# Patient Record
Sex: Female | Born: 1980 | Race: White | Hispanic: No | Marital: Married | Smoking: Never smoker
Health system: Southern US, Community
[De-identification: ages and names within clinical notes are randomized; demographics above are authoritative.]

## PROBLEM LIST (undated history)

## (undated) DIAGNOSIS — K259 Gastric ulcer, unspecified as acute or chronic, without hemorrhage or perforation: Secondary | ICD-10-CM

## (undated) DIAGNOSIS — D649 Anemia, unspecified: Secondary | ICD-10-CM

## (undated) DIAGNOSIS — F329 Major depressive disorder, single episode, unspecified: Secondary | ICD-10-CM

## (undated) DIAGNOSIS — IMO0002 Reserved for concepts with insufficient information to code with codable children: Secondary | ICD-10-CM

## (undated) DIAGNOSIS — J45909 Unspecified asthma, uncomplicated: Secondary | ICD-10-CM

## (undated) DIAGNOSIS — N390 Urinary tract infection, site not specified: Secondary | ICD-10-CM

## (undated) DIAGNOSIS — N946 Dysmenorrhea, unspecified: Secondary | ICD-10-CM

## (undated) DIAGNOSIS — F419 Anxiety disorder, unspecified: Secondary | ICD-10-CM

## (undated) DIAGNOSIS — N809 Endometriosis, unspecified: Secondary | ICD-10-CM

## (undated) DIAGNOSIS — F32A Depression, unspecified: Secondary | ICD-10-CM

## (undated) DIAGNOSIS — B9620 Unspecified Escherichia coli [E. coli] as the cause of diseases classified elsewhere: Secondary | ICD-10-CM

## (undated) DIAGNOSIS — R51 Headache: Secondary | ICD-10-CM

## (undated) HISTORY — DX: Major depressive disorder, single episode, unspecified: F32.9

## (undated) HISTORY — DX: Anemia, unspecified: D64.9

## (undated) HISTORY — DX: Depression, unspecified: F32.A

## (undated) HISTORY — PX: CHOLECYSTECTOMY: SHX55

## (undated) HISTORY — DX: Reserved for concepts with insufficient information to code with codable children: IMO0002

## (undated) HISTORY — DX: Dysmenorrhea, unspecified: N94.6

## (undated) HISTORY — DX: Anxiety disorder, unspecified: F41.9

## (undated) HISTORY — DX: Gastric ulcer, unspecified as acute or chronic, without hemorrhage or perforation: K25.9

## (undated) HISTORY — DX: Endometriosis, unspecified: N80.9

## (undated) HISTORY — PX: APPENDECTOMY: SHX54

---

## 2013-06-04 ENCOUNTER — Ambulatory Visit (INDEPENDENT_AMBULATORY_CARE_PROVIDER_SITE_OTHER): Payer: BC Managed Care – PPO | Admitting: Obstetrics and Gynecology

## 2013-06-04 ENCOUNTER — Encounter: Payer: Self-pay | Admitting: Obstetrics and Gynecology

## 2013-06-04 VITALS — BP 98/50 | HR 64 | Ht 59.0 in | Wt 100.0 lb

## 2013-06-04 DIAGNOSIS — Z01419 Encounter for gynecological examination (general) (routine) without abnormal findings: Secondary | ICD-10-CM

## 2013-06-04 DIAGNOSIS — N926 Irregular menstruation, unspecified: Secondary | ICD-10-CM

## 2013-06-04 DIAGNOSIS — N946 Dysmenorrhea, unspecified: Secondary | ICD-10-CM

## 2013-06-04 DIAGNOSIS — Z0000 Encounter for general adult medical examination without abnormal findings: Secondary | ICD-10-CM

## 2013-06-04 DIAGNOSIS — Z113 Encounter for screening for infections with a predominantly sexual mode of transmission: Secondary | ICD-10-CM

## 2013-06-04 LAB — STD PANEL

## 2013-06-04 LAB — POCT URINALYSIS DIPSTICK
Blood, UA: NEGATIVE
Nitrite, UA: NEGATIVE
Protein, UA: NEGATIVE
Urobilinogen, UA: NEGATIVE

## 2013-06-04 LAB — HEPATITIS C ANTIBODY: HCV Ab: NEGATIVE

## 2013-06-04 MED ORDER — IBUPROFEN 800 MG PO TABS: 800.0000 mg | ORAL_TABLET | Freq: Three times a day (TID) | ORAL | Status: DC | PRN

## 2013-06-04 MED ORDER — TRAMADOL HCL 50 MG PO TABS: 50.0000 mg | ORAL_TABLET | Freq: Four times a day (QID) | ORAL | Status: DC | PRN

## 2013-06-04 NOTE — Patient Instructions (Addendum)
EXERCISE AND DIET:  We recommended that you start or continue a regular exercise program for good health. Regular exercise means any activity that makes your heart beat faster and makes you sweat.  We recommend exercising at least 30 minutes per day at least 3 days a week, preferably 4 or 5.  We also recommend a diet low in fat and sugar.  Inactivity, poor dietary choices and obesity can cause diabetes, heart attack, stroke, and kidney damage, among others.    ALCOHOL AND SMOKING:  Women should limit their alcohol intake to no more than 7 drinks/beers/glasses of wine (combined, not each!) per week. Moderation of alcohol intake to this level decreases your risk of breast cancer and liver damage. And of course, no recreational drugs are part of a healthy lifestyle.  And absolutely no smoking or even second hand smoke. Most people know smoking can cause heart and lung diseases, but did you know it also contributes to weakening of your bones? Aging of your skin?  Yellowing of your teeth and nails?  CALCIUM AND VITAMIN D:  Adequate intake of calcium and Vitamin D are recommended.  The recommendations for exact amounts of these supplements seem to change often, but generally speaking 600 mg of calcium (either carbonate or citrate) and 800 units of Vitamin D per day seems prudent. Certain women may benefit from higher intake of Vitamin D.  If you are among these women, your doctor will have told you during your visit.    PAP SMEARS:  Pap smears, to check for cervical cancer or precancers,  have traditionally been done yearly, although recent scientific advances have shown that most women can have pap smears less often.  However, every woman still should have a physical exam from her gynecologist every year. It will include a breast check, inspection of the vulva and vagina to check for abnormal growths or skin changes, a visual exam of the cervix, and then an exam to evaluate the size and shape of the uterus and  ovaries.  And after 30 years of age, a rectal exam is indicated to check for rectal cancers. We will also provide age appropriate advice regarding health maintenance, like when you should have certain vaccines, screening for sexually transmitted diseases, bone density testing, colonoscopy, mammograms, etc.   MAMMOGRAMS:  All women over 40 years old should have a yearly mammogram. Many facilities now offer a "3D" mammogram, which may cost around $50 extra out of pocket. If possible,  we recommend you accept the option to have the 3D mammogram performed.  It both reduces the number of women who will be called back for extra views which then turn out to be normal, and it is better than the routine mammogram at detecting truly abnormal areas.    COLONOSCOPY:  Colonoscopy to screen for colon cancer is recommended for all women at age 50.  We know, you hate the idea of the prep.  We agree, BUT, having colon cancer and not knowing it is worse!!  Colon cancer so often starts as a polyp that can be seen and removed at colonscopy, which can quite literally save your life!  And if your first colonoscopy is normal and you have no family history of colon cancer, most women don't have to have it again for 10 years.  Once every ten years, you can do something that may end up saving your life, right?  We will be happy to help you get it scheduled when you are ready.    Be sure to check your insurance coverage so you understand how much it will cost.  It may be covered as a preventative service at no cost, but you should check your particular policy.    Levonorgestrel intrauterine device (IUD) What is this medicine? LEVONORGESTREL IUD (LEE voe nor jes trel) is a contraceptive (birth control) device. The device is placed inside the uterus by a healthcare professional. It is used to prevent pregnancy and can also be used to treat heavy bleeding that occurs during your period. Depending on the device, it can be used for 3 to 5  years. This medicine may be used for other purposes; ask your health care provider or pharmacist if you have questions. What should I tell my health care provider before I take this medicine? They need to know if you have any of these conditions: -abnormal Pap smear -cancer of the breast, uterus, or cervix -diabetes -endometritis -genital or pelvic infection now or in the past -have more than one sexual partner or your partner has more than one partner -heart disease -history of an ectopic or tubal pregnancy -immune system problems -IUD in place -liver disease or tumor -problems with blood clots or take blood-thinners -use intravenous drugs -uterus of unusual shape -vaginal bleeding that has not been explained -an unusual or allergic reaction to levonorgestrel, other hormones, silicone, or polyethylene, medicines, foods, dyes, or preservatives -pregnant or trying to get pregnant -breast-feeding How should I use this medicine? This device is placed inside the uterus by a health care professional. Talk to your pediatrician regarding the use of this medicine in children. Special care may be needed. Overdosage: If you think you have taken too much of this medicine contact a poison control center or emergency room at once. NOTE: This medicine is only for you. Do not share this medicine with others. What if I miss a dose? This does not apply. What may interact with this medicine? Do not take this medicine with any of the following medications: -amprenavir -bosentan -fosamprenavir This medicine may also interact with the following medications: -aprepitant -barbiturate medicines for inducing sleep or treating seizures -bexarotene -griseofulvin -medicines to treat seizures like carbamazepine, ethotoin, felbamate, oxcarbazepine, phenytoin, topiramate -modafinil -pioglitazone -rifabutin -rifampin -rifapentine -some medicines to treat HIV infection like atazanavir, indinavir,  lopinavir, nelfinavir, tipranavir, ritonavir -St. John's wort -warfarin This list may not describe all possible interactions. Give your health care provider a list of all the medicines, herbs, non-prescription drugs, or dietary supplements you use. Also tell them if you smoke, drink alcohol, or use illegal drugs. Some items may interact with your medicine. What should I watch for while using this medicine? Visit your doctor or health care professional for regular check ups. See your doctor if you or your partner has sexual contact with others, becomes HIV positive, or gets a sexual transmitted disease. This product does not protect you against HIV infection (AIDS) or other sexually transmitted diseases. You can check the placement of the IUD yourself by reaching up to the top of your vagina with clean fingers to feel the threads. Do not pull on the threads. It is a good habit to check placement after each menstrual period. Call your doctor right away if you feel more of the IUD than just the threads or if you cannot feel the threads at all. The IUD may come out by itself. You may become pregnant if the device comes out. If you notice that the IUD has come out use a backup birth  control method like condoms and call your health care provider. Using tampons will not change the position of the IUD and are okay to use during your period. What side effects may I notice from receiving this medicine? Side effects that you should report to your doctor or health care professional as soon as possible: -allergic reactions like skin rash, itching or hives, swelling of the face, lips, or tongue -fever, flu-like symptoms -genital sores -high blood pressure -no menstrual period for 6 weeks during use -pain, swelling, warmth in the leg -pelvic pain or tenderness -severe or sudden headache -signs of pregnancy -stomach cramping -sudden shortness of breath -trouble with balance, talking, or walking -unusual  vaginal bleeding, discharge -yellowing of the eyes or skin Side effects that usually do not require medical attention (report to your doctor or health care professional if they continue or are bothersome): -acne -breast pain -change in sex drive or performance -changes in weight -cramping, dizziness, or faintness while the device is being inserted -headache -irregular menstrual bleeding within first 3 to 6 months of use -nausea This list may not describe all possible side effects. Call your doctor for medical advice about side effects. You may report side effects to FDA at 1-800-FDA-1088. Where should I keep my medicine? This does not apply. NOTE: This sheet is a summary. It may not cover all possible information. If you have questions about this medicine, talk to your doctor, pharmacist, or health care provider.  2013, [employer]/Gold Standard. (12/20/2011 1:54:04 PM) Dysmenorrhea Menstrual pain is caused by the muscles of the uterus tightening (contracting) during a menstrual period. The muscles of the uterus contract due to the chemicals in the uterine lining. Primary dysmenorrhea is menstrual cramps that last a couple of days when you start having menstrual periods or soon after. This often begins after a teenager starts having her period. As a woman gets older or has a baby, the cramps will usually lesson or disappear. Secondary dysmenorrhea begins later in life, lasts longer, and the pain may be stronger than primary dysmenorrhea. The pain may start before the period and last a few days after the period. This type of dysmenorrhea is usually caused by an underlying problem such as:  The tissue lining the uterus grows outside of the uterus in other areas of the body (endometriosis).  The endometrial tissue, which normally lines the uterus, is found in or grows into the muscular walls of the uterus (adenomyosis).  The pelvic blood vessels are engorged with blood just before the menstrual  period (pelvic congestive syndrome).  Overgrowth of cells in the lining of the uterus or cervix (polyps of the uterus or cervix).  Falling down of the uterus (prolapse) because of loose or stretched ligaments.  Depression.  Bladder problems, infection, or inflammation.  Problems with the intestine, a tumor, or irritable bowel syndrome.  Cancer of the female organs or bladder.  A severely tipped uterus.  A very tight opening or closed cervix.  Noncancerous tumors of the uterus (fibroids).  Pelvic inflammatory disease (PID).  Pelvic scarring (adhesions) from a previous surgery.  Ovarian cyst.  An intrauterine device (IUD) used for birth control. CAUSES  The cause of menstrual pain is often unknown. SYMPTOMS   Cramping or throbbing pain in your lower abdomen.  Sometimes, a woman may also experience headaches.  Lower back pain.  Feeling sick to your stomach (nausea) or vomiting.  Diarrhea.  Sweating or dizziness. DIAGNOSIS  A diagnosis is based on your history, symptoms, physical examination,  diagnostic tests, or procedures. Diagnostic tests or procedures may include:  Blood tests.  An ultrasound.  An examination of the lining of the uterus (dilation and curettage, D&C).  An examination inside your abdomen or pelvis with a scope (laparoscopy).  X-rays.  CT Scan.  MRI.  An examination inside the bladder with a scope (cystoscopy).  An examination inside the intestine or stomach with a scope (colonoscopy, gastroscopy). TREATMENT  Treatment depends on the cause of the dysmenorrhea. Treatment may include:  Pain medicine prescribed by your caregiver.  Birth control pills.  Hormone replacement therapy.  Nonsteroidal anti-inflammatory drugs (NSAIDs). These may help stop the production of prostaglandins.  An IUD with progesterone hormone in it.  Acupuncture.  Surgery to remove adhesions, endometriosis, ovarian cyst, or fibroids.  Removal of the uterus  (hysterectomy).  Progesterone shots to stop the menstrual period.  Cutting the nerves on the sacrum that go to the female organs (presacral neurectomy).  Electric currant to the sacral nerves (sacral nerve stimulation).  Antidepressant medicine.  Psychiatric therapy, counseling, or group therapy.  Exercise and physical therapy.  Meditation and yoga therapy. HOME CARE INSTRUCTIONS   Only take over-the-counter or prescription medicines for pain, discomfort, or fever as directed by your caregiver.  Place a heating pad or hot water bottle on your lower back or abdomen. Do not sleep with the heating pad.  Use aerobic exercises, walking, swimming, biking, and other exercises to help lessen the cramping.  Massage to the lower back or abdomen may help.  Stop smoking.  Avoid alcohol and caffeine.  Yoga, meditation, or acupuncture may help. SEEK MEDICAL CARE IF:   The pain does not get better with medicine.  You have pain with sexual intercourse. SEEK IMMEDIATE MEDICAL CARE IF:   Your pain increases and is not controlled with medicines.  You have a fever.  You develop nausea or vomiting with your period not controlled with medicine.  You have abnormal vaginal bleeding with your period.  You pass out. MAKE SURE YOU:   Understand these instructions.  Will watch your condition.  Will get help right away if you are not doing well or get worse. Document Released: 11/19/2005 Document Revised: 02/11/2012 Document Reviewed: 03/07/2009 [facility] Patient Information 2014 [place], [place].

## 2013-06-04 NOTE — Progress Notes (Signed)
Patient ID: Sheri Cameron, female   DOB: March 05, 1981, 32 y.o.   MRN: [id number] 32 y.o.   Single    Caucasian   female   [id number]   here for annual exam.   Patient states having horrible period pain, bleeding between periods.  Two to three periods a month.  This begin the last three months.  Taking pills on time every day.  Began a generic pill. Takes Midol for period cramps which is not helping.  Occasional painful intercourse.    Considering a switch to go off the pill. Wants something more reliable and better pain control. Worried about weight gain.    Patient's last menstrual period was 05/25/2013.          Sexually active: yes  The current method of family planning is OCP (estrogen/progesterone).    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  Had Colposcopy and LEEP which revealed LSIL.  Believes her paps were normal after this.  Smoking:no Alcohol: no Last colonoscopy:2012 had EGD/Colonoscopy due to abd. Pain.  Found to have E.Coli and gastric ulcer.[facility], [place]) - patient had this infection after was in [place].   Last Bone Density:  never Last tetanus shot: unsure Last cholesterol check: unsure  UPT negative Hgb:  13.6              Urine: Neg   Family History  Problem Relation Age of Onset  . Cancer Father     cholangiocarcinoma  . Breast cancer Paternal Grandmother   . Hypertension Paternal Grandmother   . Diabetes Paternal Grandmother   . Cancer Paternal Grandfather     cholangiocarcinoma/liver ca  . Cancer Maternal Grandfather     pancreatic cancer  . Ovarian cancer Maternal Aunt   . Hypertension Maternal Grandmother   . Diabetes Maternal Grandmother     There are no active problems to display for this patient.   Past Medical History  Diagnosis Date  . Anxiety   . Anemia   . Depression   . Dyspareunia     Past Surgical History  Procedure Laterality Date  . Appendectomy    .  Cholecystectomy      Allergies: Augmentin and Erythromycin  Current Outpatient Prescriptions  Medication Sig Dispense Refill  . levonorgestrel-ethinyl estradiol (AVIANE,ALESSE,LESSINA) 0.1-20 MG-MCG tablet Take 1 tablet by mouth daily.      . sertraline (ZOLOFT) 25 MG tablet Take 25 mg by mouth daily. Take 2 tabs daily      . traZODone (DESYREL) 100 MG tablet Take 100 mg by mouth at bedtime. 1/2 to 2 tablets at bedtime       No current facility-administered medications for this visit.    ROS: Pertinent items are noted in HPI.  Social Hx:  Engaged.  Marrying next April.  Will consider childbearing in April 2016.    Exam:    BP 98/50  Pulse 64  Ht 4\' 11"  (1.499 m)  Wt 100 lb (45.36 kg)  BMI 20.19 kg/m2  LMP 05/25/2013   Wt Readings from Last 3 Encounters:  06/04/13 100 lb (45.36 kg)     Ht Readings from Last 3 Encounters:  06/04/13 4\' 11"  (1.499 m)    General appearance: alert, cooperative and appears stated age Head: Normocephalic, without obvious abnormality, atraumatic Neck: no adenopathy, supple, symmetrical, trachea midline and thyroid not enlarged, symmetric, no tenderness/mass/nodules Lungs: clear to auscultation bilaterally Breasts: Inspection negative, No nipple retraction  or dimpling, No nipple discharge or bleeding, No axillary or supraclavicular adenopathy, Normal to palpation without dominant masses Heart: regular rate and rhythm Abdomen: soft, non-tender; bowel sounds normal; no masses,  no organomegaly Extremities: extremities normal, atraumatic, no cyanosis or edema Skin: Skin color, texture, turgor normal. Pustular rash of chest skin. Lymph nodes: Cervical, supraclavicular, and axillary nodes normal. No abnormal inguinal nodes palpated Neurologic: Grossly normal   Pelvic: External genitalia:  no lesions              Urethra:  normal appearing urethra with no masses, tenderness or lesions              Bartholins and Skenes: normal                  Vagina: normal appearing vagina with normal color and discharge, no lesions              Cervix: normal appearance              Pap taken: yes        Bimanual Exam:  Uterus:  uterus is normal size, shape, consistency and nontender                                      Adnexa: normal adnexa in size, nontender and no masses                                        A: normal gynecologic exam History of LEEP Dysmenorrhea History of peptic ulcer disease.     P: Do self breast exam pap smear Tramadol for pain.  See Epic orders. Discussed OCPs, NuvaRing, Skyla IUD.  Wants to pursue IUD.  Risks and benefits discussed with patient.  return annually or prn     An After Visit Summary was printed and given to the patient.

## 2013-06-08 LAB — IPS PAP TEST WITH REFLEX TO HPV

## 2013-06-10 ENCOUNTER — Telehealth: Payer: Self-pay | Admitting: Obstetrics and Gynecology

## 2013-06-10 NOTE — Telephone Encounter (Signed)
LMTCB during first five days of cycle to schedule Skyla placement.

## 2013-06-12 ENCOUNTER — Other Ambulatory Visit: Payer: Self-pay | Admitting: Obstetrics and Gynecology

## 2013-06-12 DIAGNOSIS — Z309 Encounter for contraceptive management, unspecified: Secondary | ICD-10-CM

## 2013-06-15 ENCOUNTER — Telehealth: Payer: Self-pay | Admitting: Obstetrics and Gynecology

## 2013-06-15 ENCOUNTER — Ambulatory Visit (INDEPENDENT_AMBULATORY_CARE_PROVIDER_SITE_OTHER): Payer: BC Managed Care – PPO

## 2013-06-15 ENCOUNTER — Other Ambulatory Visit: Payer: Self-pay | Admitting: Obstetrics and Gynecology

## 2013-06-15 ENCOUNTER — Ambulatory Visit (INDEPENDENT_AMBULATORY_CARE_PROVIDER_SITE_OTHER): Payer: BC Managed Care – PPO | Admitting: Obstetrics and Gynecology

## 2013-06-15 ENCOUNTER — Encounter: Payer: Self-pay | Admitting: Obstetrics and Gynecology

## 2013-06-15 ENCOUNTER — Encounter: Payer: BC Managed Care – PPO | Admitting: Obstetrics and Gynecology

## 2013-06-15 VITALS — BP 100/60 | HR 82 | Ht 59.0 in

## 2013-06-15 DIAGNOSIS — R102 Pelvic and perineal pain: Secondary | ICD-10-CM

## 2013-06-15 DIAGNOSIS — Q519 Congenital malformation of uterus and cervix, unspecified: Secondary | ICD-10-CM

## 2013-06-15 DIAGNOSIS — N946 Dysmenorrhea, unspecified: Secondary | ICD-10-CM

## 2013-06-15 DIAGNOSIS — N926 Irregular menstruation, unspecified: Secondary | ICD-10-CM

## 2013-06-15 DIAGNOSIS — N949 Unspecified condition associated with female genital organs and menstrual cycle: Secondary | ICD-10-CM

## 2013-06-15 DIAGNOSIS — Q5128 Other doubling of uterus, other specified: Secondary | ICD-10-CM

## 2013-06-15 MED ORDER — ETONOGESTREL-ETHINYL ESTRADIOL 0.12-0.015 MG/24HR VA RING: VAGINAL_RING | VAGINAL | Status: DC

## 2013-06-15 NOTE — Patient Instructions (Addendum)

## 2013-06-15 NOTE — Telephone Encounter (Signed)
Please precert pelvic ultrasound for evaluation of irregular vaginal bleeding.  Will need this before she can have her IUD inserted.

## 2013-06-15 NOTE — Progress Notes (Signed)
   Sujbective  Here for pelvic ultrasound for irregular vaginal bleeding.  Also having dysmenorrhea. Patient was interested in [place] IUD. Switch to generic Alesse 4 months ago.   Tried Ortho Tricyclen and had break through bleeding. Depo Provera worked well but patient states she did not have a cycle and her prior provider thought it was best to discontinue this. Considering childbearing in 2015 or 2016.  Marrying next year.  Had one miscarriage at [redacted] weeks gestation.  Objective  See ultrasound below - Subseptate uterus with thin endometrium.  No polyp or fibroids noted.  Normal ovaries.      Assessment  Subseptate uterus - Mullerian anomaly. Irregular menses on OCPs. Dysmenorrhea. Desire for future fertility.  Plan  Switch to NuvaRing.  Use each ring for 3 weeks, and do this for 3 rings in a row.  Patient given one sample with Lot number [id number], Expiration 09/2015.  Patient instructed in use. Use condoms for the first month. Proceed with MRI of pelvis and kidneys. I discussed the subseptate uterus, affects on future fertility, and potential surgical repair with hysteroscopy and laparoscopy, which would also give an opportunity to evaluate and treat endometriosis if it is present.

## 2013-06-15 NOTE — Telephone Encounter (Signed)
Patient called to verify that she could still have her IUD placed since she is still only spotting. Today is day 4 of spotting. Canceled IUD insertion and scheduled PUS at 3:30pm per Dr. Edward Jolly.

## 2013-06-17 ENCOUNTER — Other Ambulatory Visit: Payer: Self-pay | Admitting: Obstetrics and Gynecology

## 2013-06-17 ENCOUNTER — Inpatient Hospital Stay (HOSPITAL_COMMUNITY)
Admission: AD | Admit: 2013-06-17 | Discharge: 2013-06-18 | Disposition: A | Discharge status: Home / Self Care | Payer: BC Managed Care – PPO | Source: Ambulatory Visit | Attending: Obstetrics & Gynecology | Admitting: Obstetrics & Gynecology

## 2013-06-17 DIAGNOSIS — R1013 Epigastric pain: Secondary | ICD-10-CM | POA: Insufficient documentation

## 2013-06-17 DIAGNOSIS — R112 Nausea with vomiting, unspecified: Secondary | ICD-10-CM | POA: Insufficient documentation

## 2013-06-17 DIAGNOSIS — N946 Dysmenorrhea, unspecified: Secondary | ICD-10-CM | POA: Insufficient documentation

## 2013-06-17 DIAGNOSIS — Q5128 Other doubling of uterus, other specified: Secondary | ICD-10-CM | POA: Insufficient documentation

## 2013-06-17 DIAGNOSIS — R102 Pelvic and perineal pain: Secondary | ICD-10-CM

## 2013-06-17 LAB — COMPREHENSIVE METABOLIC PANEL
ALT: 24 U/L (ref 0–35)
AST: 24 U/L (ref 0–37)
Albumin: 4 g/dL (ref 3.5–5.2)
Alkaline Phosphatase: 61 U/L (ref 39–117)
BUN: 7 mg/dL (ref 6–23)
Chloride: 96 mEq/L (ref 96–112)
Potassium: 3.7 mEq/L (ref 3.5–5.1)
Total Bilirubin: 0.6 mg/dL (ref 0.3–1.2)

## 2013-06-17 LAB — HCG, QUANTITATIVE, PREGNANCY: hCG, Beta Chain, Quant, S: 1 m[IU]/mL (ref <5)

## 2013-06-17 NOTE — MAU Note (Signed)
Pt states vaginal bleeding and abdominal pain began Monday night. States she has been feeling light headed and dizzy since this morning. States she is suppose to be scheduled for MRI for possible diagnosis of endometriosis. States she had a pelvic ultrasound on Monday.

## 2013-06-17 NOTE — Telephone Encounter (Signed)
Sheri Cameron,  Have the patient start the NuvaRing!  The sooner she starts, the faster we may be able to get her cramping with menses under control.  It is OK to place it if she is still bleeding.  [employer]

## 2013-06-17 NOTE — Telephone Encounter (Signed)
Patient notified on instructions from Dr. Edward Jolly to insert the Nuva Ring now to help cramping and bleeding. Fannie Knee will notify when MRI scheduled.

## 2013-06-17 NOTE — MAU Note (Signed)
Pt called into triage room. Pt not in lobby. Will recheck in 15 minutes.

## 2013-06-17 NOTE — Telephone Encounter (Signed)
Patient requests "something stronger needs called in for pain." Patient reports she was up all night with pain. [facility] in [place] on [address].

## 2013-06-17 NOTE — Telephone Encounter (Signed)
Patient called. States is having severe cramping in abdomen and is passing "blood clots". States has not slept all night. Currently taking one tramadol Q 6hours as needed for pain. Last OV 06/04/2013 in EPIC Procedure pelvic ultrasound 06/15/2013 with instructions of NuvaRing. Patient states she has not used NuvaRing yet due to severe cramping she is having and bleeding. Called [facility] to schedule MRI of abdomen. Was told order would need to be with and wothout contrast. Dr. Edward Jolly aware of this and instructions from [facility] of how to put in MRI of pelvis and abdomenal with and without contrast is ; MRI  (space) Pelvis   Enter and a list will come up.  Patient notified of medication Tramadol 50mg  may be increased to 2 tablets Q 6hours as needed for cramping, pain. Patient understands this.

## 2013-06-18 ENCOUNTER — Other Ambulatory Visit: Payer: Self-pay | Admitting: Obstetrics and Gynecology

## 2013-06-18 ENCOUNTER — Telehealth: Payer: Self-pay | Admitting: Orthopedic Surgery

## 2013-06-18 DIAGNOSIS — N946 Dysmenorrhea, unspecified: Secondary | ICD-10-CM

## 2013-06-18 DIAGNOSIS — N949 Unspecified condition associated with female genital organs and menstrual cycle: Secondary | ICD-10-CM

## 2013-06-18 LAB — URINALYSIS, ROUTINE W REFLEX MICROSCOPIC
Bilirubin Urine: NEGATIVE
Glucose, UA: NEGATIVE mg/dL
Ketones, ur: NEGATIVE mg/dL
Protein, ur: NEGATIVE mg/dL

## 2013-06-18 LAB — URINE MICROSCOPIC-ADD ON

## 2013-06-18 LAB — ABO/RH: ABO/RH(D): A POS

## 2013-06-18 MED ORDER — OXYCODONE-ACETAMINOPHEN 5-325 MG PO TABS: 1.0000 | ORAL_TABLET | ORAL | Status: DC | PRN

## 2013-06-18 MED ORDER — ONDANSETRON 8 MG PO TBDP
8.0000 mg | ORAL_TABLET | Freq: Once | ORAL | Status: AC
  Administered 2013-06-18: 8 mg via ORAL
  Filled 2013-06-18: qty 1

## 2013-06-18 MED ORDER — PROMETHAZINE HCL 25 MG PO TABS: 25.0000 mg | ORAL_TABLET | Freq: Four times a day (QID) | ORAL | Status: DC | PRN

## 2013-06-18 MED ORDER — PROMETHAZINE HCL 25 MG PO TABS
25.0000 mg | ORAL_TABLET | Freq: Once | ORAL | Status: AC
  Administered 2013-06-18: 25 mg via ORAL
  Filled 2013-06-18: qty 1

## 2013-06-18 MED ORDER — OXYCODONE-ACETAMINOPHEN 5-325 MG PO TABS
2.0000 | ORAL_TABLET | Freq: Once | ORAL | Status: AC
  Administered 2013-06-18: 2 via ORAL
  Filled 2013-06-18: qty 2

## 2013-06-18 MED ORDER — KETOROLAC TROMETHAMINE 60 MG/2ML IM SOLN
60.0000 mg | Freq: Once | INTRAMUSCULAR | Status: AC
  Administered 2013-06-18: 60 mg via INTRAMUSCULAR
  Filled 2013-06-18: qty 2

## 2013-06-18 MED ORDER — ONDANSETRON 8 MG PO TBDP: 8.0000 mg | ORAL_TABLET | Freq: Three times a day (TID) | ORAL | Status: DC | PRN

## 2013-06-18 MED ORDER — KETOROLAC TROMETHAMINE 10 MG PO TABS: 10.0000 mg | ORAL_TABLET | Freq: Four times a day (QID) | ORAL | Status: DC | PRN

## 2013-06-18 NOTE — MAU Provider Note (Signed)
Chief Complaint: Vaginal Bleeding and Abdominal Pain   First Provider Initiated Contact with Patient 06/18/13 0052     SUBJECTIVE HPI: Sheri Cameron is a 32 y.o. G17P0020 female who presents with worsening bilateral low abdominal and epigastric abdominal pain, heavy vaginal bleeding, nausea and vomiting. She has a history of irregular, frequent periods and dysmenorrhea since menarche, all of which has worsened over last 3-4 months. She describes the pain as constant, 10/10 on pain scale, causing her to vomit and shake (typical for her more painful periods). LMP started 06/12/2013 as spotting times several days and progressed to heavy bleeding over the past 2 days.  Has been seen by Dr. Edward Jolly several times in the past 2 weeks for these problems. The initial plan was to insert an IUD, but Ultrasound on 06/15/2013 showed septate uterus, thin endometrial stripe and normal ovaries. She was prescribed tramadol for pain but states it doesn't help. Increased 100 mg doses without relief of pain. Prescribed NuvaRing for Cytotec on cramping. Patient started 06/17/2013. GC Chlamydia negative 06/15/2013. MRI ordered. Plan surgery for removal of septum and evaluation/treatment of possible endometriosis.  Past Medical History  Diagnosis Date  . Anxiety   . Anemia   . Depression   . Dyspareunia    OB History   Grav Para Term Preterm Abortions TAB SAB Ect Mult Living   2    2  2         # Outc Date GA Lbr Len/2nd Wgt Sex Del Anes PTL Lv   1 SAB            2 SAB              Past Surgical History  Procedure Laterality Date  . Appendectomy    . Cholecystectomy     History   Social History  . Marital Status: Single    Spouse Name: N/A    Number of Children: N/A  . Years of Education: N/A   Occupational History  . Not on file.   Social History Main Topics  . Smoking status: Never Smoker   . Smokeless tobacco: Not on file  . Alcohol Use: No  . Drug Use: No  . Sexually Active: Yes    Birth  Control/ Protection: IUD     Comment: Aviane   Other Topics Concern  . Not on file   Social History Narrative  . No narrative on file   No current facility-administered medications on file prior to encounter.   Current Outpatient Prescriptions on File Prior to Encounter  Medication Sig Dispense Refill  . etonogestrel-ethinyl estradiol (NUVARING) 0.12-0.015 MG/24HR vaginal ring Insert vaginally and leave in place for 3 consecutive weeks.  Then insert new ring for three weeks. Medically necessary for dysmenorrhea.  3 each  3  . sertraline (ZOLOFT) 25 MG tablet Take 50 mg by mouth at bedtime. Take 2 tabs daily      . traZODone (DESYREL) 100 MG tablet Take 50 mg by mouth at bedtime. 1/2 to 2 tablets at bedtime       Allergies  Allergen Reactions  . Augmentin (Amoxicillin-Pot Clavulanate) Hives  . Erythromycin Hives    ROS: Positive for abdominal pain, epigastric pain, nausea, vomiting, chronic dyspareunia. Negative for fever, chills, constipation, diarrhea, vaginal discharge  OBJECTIVE Blood pressure 112/63, pulse 76, temperature 97.5 F (36.4 C), temperature source Oral, resp. rate 18, last menstrual period 06/12/2013, SpO2 100.00%. GENERAL: Well-developed, slender female in severe distress, shaking, knees pulled into chest.  HEENT:  Normocephalic HEART: normal rate RESP: normal effort ABDOMEN: Soft, moderate generalized low abdominal and epigastric tenderness. Normal bowel sounds x4. Voluntary guarding. No mass or rebound tenderness. BACK: No CVAT EXTREMITIES: Nontender, no edema NEURO: Alert and oriented SPECULUM EXAM: NEFG, small amount of bright red blood noted, cervix non--friable BIMANUAL: cervix closed; uterus normal size, bilateral adnexal tenderness and cervical motion tenderness (consistent with prior exams per patient). No masses.  LAB RESULTS Results for orders placed during the hospital encounter of 06/17/13 (from the past 24 hour(s))  HCG, QUANTITATIVE, PREGNANCY      Status: None   Collection Time    06/17/13  9:50 PM      Result Value Range   hCG, Beta Chain, Quant, S 1  <5 mIU/mL  ABO/RH     Status: None   Collection Time    06/17/13  9:50 PM      Result Value Range   ABO/RH(D) A POS    COMPREHENSIVE METABOLIC PANEL     Status: Abnormal   Collection Time    06/17/13  9:50 PM      Result Value Range   Sodium 134 (*) 135 - 145 mEq/L   Potassium 3.7  3.5 - 5.1 mEq/L   Chloride 96  96 - 112 mEq/L   CO2 28  19 - 32 mEq/L   Glucose, Bld 100 (*) 70 - 99 mg/dL   BUN 7  6 - 23 mg/dL   Creatinine, Ser 7.82  0.50 - 1.10 mg/dL   Calcium 9.8  8.4 - 95.6 mg/dL   Total Protein 7.2  6.0 - 8.3 g/dL   Albumin 4.0  3.5 - 5.2 g/dL   AST 24  0 - 37 U/L   ALT 24  0 - 35 U/L   Alkaline Phosphatase 61  39 - 117 U/L   Total Bilirubin 0.6  0.3 - 1.2 mg/dL   GFR calc non Af Amer >90  >90 mL/min   GFR calc Af Amer >90  >90 mL/min  URINALYSIS, ROUTINE W REFLEX MICROSCOPIC     Status: Abnormal   Collection Time    06/17/13 10:45 PM      Result Value Range   Color, Urine YELLOW  YELLOW   APPearance CLEAR  CLEAR   Specific Gravity, Urine >1.030 (*) 1.005 - 1.030   pH 6.0  5.0 - 8.0   Glucose, UA NEGATIVE  NEGATIVE mg/dL   Hgb urine dipstick LARGE (*) NEGATIVE   Bilirubin Urine NEGATIVE  NEGATIVE   Ketones, ur NEGATIVE  NEGATIVE mg/dL   Protein, ur NEGATIVE  NEGATIVE mg/dL   Urobilinogen, UA 1.0  0.0 - 1.0 mg/dL   Nitrite NEGATIVE  NEGATIVE   Leukocytes, UA NEGATIVE  NEGATIVE  URINE MICROSCOPIC-ADD ON     Status: None   Collection Time    06/17/13 10:45 PM      Result Value Range   Squamous Epithelial / LPF RARE  RARE   WBC, UA 0-2  <3 WBC/hpf   RBC / HPF 3-6  <3 RBC/hpf   Urine-Other CA OXALATE CRYSTALS      IMAGING N/A  MAU COURSE Pain decreased to 7/10 after Percocet. Patient in no apparent distress. Requesting medication for nausea. Phenergan ordered. Nausea and vomiting improving.  Discussed patient's symptoms, history, ultrasound results,  response to Percocet, plan of care per Dr. Rica Records notes with Dr. Audie Box. No surgical or life-threatening condition evident. Add Toradol for better pain control and followup with Dr. Edward Jolly in the office  today. May prescribe Percocet for pain.  Patient vomited once more before discharge but stated she was feeling much better. Zofran ODT given. Patient relaxed and smiling.  ASSESSMENT 1. Pelvic pain in female   2. Nausea & vomiting     PLAN Discharge home in stable condition. Did not overlap Percocet with tramadol. Use caution with taking multiple sedating medications. Urine culture pending. Follow-up Information   Call Conley Simmonds, MD. (today)    Contact information:   [address] [place] [address] [phone]       Follow up with [facility]. (As needed in emergencies)    Contact information:   [address] [id number] [place] [address] [phone]        Medication List         etonogestrel-ethinyl estradiol 0.12-0.015 MG/24HR vaginal ring  Commonly known as:  NUVARING  Insert vaginally and leave in place for 3 consecutive weeks.  Then insert new ring for three weeks. Medically necessary for dysmenorrhea.     ketorolac 10 MG tablet  Commonly known as:  TORADOL  Take 1 tablet (10 mg total) by mouth every 6 (six) hours as needed for pain.     ondansetron 8 MG disintegrating tablet  Commonly known as:  ZOFRAN ODT  Take 1 tablet (8 mg total) by mouth every 8 (eight) hours as needed for nausea.     oxyCODONE-acetaminophen 5-325 MG per tablet  Commonly known as:  PERCOCET/ROXICET  Take 1 tablet by mouth every 4 (four) hours as needed for pain. For breakthrough pain.     promethazine 25 MG tablet  Commonly known as:  PHENERGAN  Take 1 tablet (25 mg total) by mouth every 6 (six) hours as needed for nausea.     sertraline 25 MG tablet  Commonly known as:  ZOLOFT  Take 50 mg by mouth  at bedtime. Take 2 tabs daily     traMADol 50 MG tablet  Commonly known as:  ULTRAM  Take 100 mg by mouth every 6 (six) hours as needed for pain.     traZODone 100 MG tablet  Commonly known as:  DESYREL  Take 50 mg by mouth at bedtime. 1/2 to 2 tablets at bedtime       Dorathy Kinsman, [place] 06/18/2013  3:42 AM

## 2013-06-18 NOTE — Telephone Encounter (Signed)
See phone note from 06-18-13.

## 2013-06-18 NOTE — Telephone Encounter (Signed)
Pt states she went to ER last night for severe cramping and pain associated with her cycle. She was given oxycodone, phenergan, and zofran. Pt states she is doing much better now, "as long as I take my pain meds." Pt was told to follow up with Dr. Edward Jolly. Pt supposed to have an MRI done per BS from last visit. Pt would like to have MRI done, then come in for a visit. Sched OV for next week 06-25-13 at 1:15 with BS. Will call pt back about MRI scheduling.

## 2013-06-18 NOTE — Telephone Encounter (Signed)
Spoke with pt about appt at [facility] for MRI of pelvis. Appt 06-21-13 at 2 pm. [address] location. Pt agreeable.

## 2013-06-21 ENCOUNTER — Ambulatory Visit
Admission: RE | Admit: 2013-06-21 | Discharge: 2013-06-21 | Disposition: A | Discharge status: Home / Self Care | Payer: BC Managed Care – PPO | Source: Ambulatory Visit | Attending: Obstetrics and Gynecology | Admitting: Obstetrics and Gynecology

## 2013-06-21 MED ORDER — GADOBENATE DIMEGLUMINE 529 MG/ML IV SOLN
9.0000 mL | Freq: Once | INTRAVENOUS | Status: AC | PRN
  Administered 2013-06-21: 9 mL via INTRAVENOUS

## 2013-06-23 ENCOUNTER — Telehealth: Payer: Self-pay | Admitting: *Deleted

## 2013-06-23 NOTE — Telephone Encounter (Signed)
Patient calling of concern of severe cramping and backache even though she is not on her cycle. Stated she did have her MRI of Pelvis done on Sunday 06/21/2013 . States she was not sure to take pain medication of tramadol that she was given in the E.R. . Explained to patient to take her pain medication Tramadol as needed q 6 hours. Has appt. To follow up with Dr. Edward Jolly on Thursday. Patient was calling also to let Dr. Edward Jolly know of her family history. Was told her mom has never had any history of endometriosis but that her moms sisters have . One sister had to have complete hysterectomy due to endometriosis Called  Imaging to check on report of MRI Abdomen. Was told that Dr. Grayling Congress stated that MRI of Abdomen was not necessary because the kidneys would show on the pelvis MRI and there was no need to do MRI Abdomen and charge patient when the same thing would be seen on the MRI of pelvis./kidneys. Last OV 06/15/2013

## 2013-06-23 NOTE — Telephone Encounter (Signed)
Patient calling with questions for nurse about menses and has found out she has family history of endometriosis.

## 2013-06-24 ENCOUNTER — Telehealth: Payer: Self-pay | Admitting: Obstetrics and Gynecology

## 2013-06-24 NOTE — Telephone Encounter (Signed)
Fannie Knee,  Thank you for the update.  Please call [facility] back and have them add an addendum to the MRI regarding her kidneys and ureters.  There is no documentation on the pelvic MRI about this.    [employer]

## 2013-06-24 NOTE — Telephone Encounter (Signed)
[facility] as requested per Dr. Edward Jolly. Spoke with receptionist , explained what was needed for Dr. Edward Jolly for the report of MRI of pelvis done 06/21/2013/. Dr.Silva request addendum of MRI of kidneys and ureters. Was put on hold. Was told to call medical records at the [address] location and leave message of this. Was transferred to [address] medical records dept. And left message regarding patient MRI and addendum that was needed.

## 2013-06-24 NOTE — Telephone Encounter (Signed)
Victorino Dike from [facility] call and let message that she has a cal into [facility] to do and addendum .   Victorino Dike #  [phone]

## 2013-06-25 ENCOUNTER — Encounter: Payer: Self-pay | Admitting: Obstetrics and Gynecology

## 2013-06-25 ENCOUNTER — Telehealth: Payer: Self-pay | Admitting: Obstetrics and Gynecology

## 2013-06-25 ENCOUNTER — Ambulatory Visit (INDEPENDENT_AMBULATORY_CARE_PROVIDER_SITE_OTHER): Payer: BC Managed Care – PPO | Admitting: Obstetrics and Gynecology

## 2013-06-25 VITALS — BP 100/62 | HR 60 | Ht 59.0 in | Wt 99.5 lb

## 2013-06-25 DIAGNOSIS — Q51818 Other congenital malformations of uterus: Secondary | ICD-10-CM

## 2013-06-25 DIAGNOSIS — Q519 Congenital malformation of uterus and cervix, unspecified: Secondary | ICD-10-CM

## 2013-06-25 DIAGNOSIS — N946 Dysmenorrhea, unspecified: Secondary | ICD-10-CM | POA: Insufficient documentation

## 2013-06-25 NOTE — Telephone Encounter (Signed)
Phone call to patient to clarify her diagnosis is bicornuate uterus.    No answer.  Left message for her to call back.

## 2013-06-25 NOTE — Patient Instructions (Signed)
Please call if your pain continues.

## 2013-06-25 NOTE — Progress Notes (Signed)
Patient ID: Sheri Cameron, female   DOB: 1981/09/29, 32 y.o.   MRN: [id number]  Subjective  Here for follow up of MRI and follow up of emergency department visit.   Patient's mother is present for the discussion today.  Went to ER at [facility] and was treated for pain 06/17/13.   Pain began with menses.  Doubled over with pain, nausea, vomiting.   Has a history of painful menstruation and missing work. Patient received Rx for Zofran, phenergan, and Percocet. Placed Nuva Ring about 10 days ago.    Has had negative GC/CT.  Still having some pain.  Wants surgery for uterine septum removal and laparoscopy for pain. Pain will need to wait until after November 2nd due to new employment.   Heide Spark is on April 25th.    History of laparoscopic cholecystectomy and appendectomy.  History of SAB at [redacted] weeks gestation.   Objective  General - smiling and talkative.  No pelvic exam today.  Addendum    Danae Orleans, MD   Wed Jun 24, 2013  3:03:11 PM EDT       **ADDENDUM** CREATED: 06/24/2013 15:00:27   These findings were discussed with Dr. Edward Jolly by telephone on 06/24/2013.  The scout images provide limited visualization of the kidneys, however both kidneys are normally positioned in the abdomen and no hydronephrosis seen.   **END ADDENDUM** SIGNED BY: John A. Eppie Gibson, M.D.     Study Result    *RADIOLOGY REPORT*   Clinical Data: Mullerian duct anomaly.   MRI PELVIS WITHOUT AND WITH CONTRAST   Technique:  Multiplanar multisequence MR imaging of the pelvis was performed both before and after administration of intravenous contrast.   Contrast: 9mL MULTIHANCE GADOBENATE DIMEGLUMINE 529 MG/ML IV SOLN   Comparison: Ultrasound from [facility] on 06/15/2013   Findings: The uterus is anteverted, and measures 5.4 x 2.6 x 4.7 cm. A septum extends from the uterine fundus into the upper portion of the endocervical canal. The uterine fundal contour shows a  mild cleft, and the apex of the fundal contour is less than 5 mm above the inter ostial line, which is consistent with a bicornuate uterus.  A single cervix is seen, which is normal in appearance.   No evidence of uterine fibroids.  No evidence of abnormal endometrial thickening.  Vagina is normal in appearance.  Nuvaring noted within the vagina.   Both ovaries are normal in appearance.  No evidence of adnexal mass or free fluid.  No other pelvic masses or lymphadenopathy identified.  No evidence of inflammatory process or abnormal fluid collections.   IMPRESSION:   1.  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 2.  No evidence of fibroids. 3.  Normal ovaries.  No adnexal mass identified.     Original Report Authenticated By: Myles Rosenthal, M.D.          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 On MRI review with Dr. Eppie Gibson, the patient has a thin septum separating the two side of the uterine cavity and that her diagnosis is  A bicornuate uterus due to the small 5 mm cleft from the interostial line.  She does not a thick wall separating the two sides.  Separating the two cavities.    Dysmenorrhea and pelvic pain suspicious for endometriosis.   Plan  Patient will do continuous Nuva Ring, changing every three weeks.  We discussed surgical repair with a hysteroscopic resection of the septal wall with laparoscopic guidance.  This will also allow an opportunity for Diagnosis and treatment of endometriosis or pelvic adhesions.  We discussed that the laparoscopy may not reveal the source of her pain and that  The septoplasty would not resolve her pain but would simply unify the cavity and reduce risk of future miscarriage and preterm labor.   Other discussed risks of the procedure include but are not limited to bleeding, infection, trauma to  surrounding organs, DVT, PE, reaction to  Anesthesia, death, hyponatremia, pulmonary edema, uterine perforation, heavy uterine bleeding requiring Foley balloon treatment and hospital observation, Development of adhesive disease intraperitoneally or in the uterine cavity, and incompleteness of the procedure.    We discussed seeking a second opinion or having a reproductive endocrinologist proceed with consultation or surgical care.  Patient declines this At this time.    Questions were invited and answered.  She would like to proceed forward toward surgical planning for November 2014.  I will review the MRI films further prior to proceeding further.  She will call if she develops recurrent significant pelvic pain.

## 2013-07-01 NOTE — Progress Notes (Signed)
This encounter was created in error - please disregard.

## 2013-07-01 NOTE — Telephone Encounter (Signed)
Patient wanting to check the date of her surrgery.

## 2013-07-01 NOTE — Telephone Encounter (Signed)
Patient states her employer needs to be able to make plans for her when she will be out for surgery.  Patient requests first available date in November since she has a lot of pain.  Offered to schedule sooner if she desires but she states she has to wait till November due to work.  Will aim for 10-06-13 and try to schedule so date will be available.    How should I post this case and will you need an assistant? Do you want me to wait until you have seen her MRI before posting?

## 2013-07-05 NOTE — Telephone Encounter (Signed)
I have reviewed the MRI personally with radiologist, Dr. Kyung Rudd at the [facility].  The septum of the uterus continues down into the cervix.  It is all very thin.  I will proceed with a hysteroscopic resection of the septum and laparoscopy for dysmenorrhea with possible treatment of endometriosis.  No robot.  Diagnoses - uterine septum, dysmenorrhea.  This will need to be schedule in the early follicular phase, just after menses ends so that the endometrium will be thin.  One way to control this and still have the date that the patient chooses for the surgery is to have her continuously use her NuvaRing.  Surgical time will be 2 1/2 hours.  I will need a physician to assist in the entire procedure, as that person will need to control the laparoscopic camera while I am working vaginally.    Please proceed with precert.

## 2013-07-06 ENCOUNTER — Ambulatory Visit: Payer: BC Managed Care – PPO | Admitting: Obstetrics and Gynecology

## 2013-07-08 ENCOUNTER — Telehealth: Payer: Self-pay | Admitting: Obstetrics and Gynecology

## 2013-07-08 NOTE — Telephone Encounter (Signed)
Patient notified of Dr Rica Records instructions.  She is anxious to proceed with surgery on first available date in November (has to wait until Nov for employer issues).  Discussed continuous Nuvaring and Dr Edward Jolly will advise her at preop appointment if/when to remove Nuvaring.  Patient denies questions and states Dr Edward Jolly has already instructed her in this.  Will schedule surgery and call her back with date and instructions.

## 2013-07-24 NOTE — Telephone Encounter (Signed)
Patient is notified and aware that Dr. Edward Jolly would like to see her before she can give her any more refills scheduled patient for OV 07/27/13 @ 12:45

## 2013-07-24 NOTE — Telephone Encounter (Signed)
Please have patient come to the office for an office visit.   I generally do not give narcotic prescription refills without being seen personally.

## 2013-07-24 NOTE — Telephone Encounter (Signed)
Patient request refills of OXYCODONE, KETOROLAC  [facility] [phone]

## 2013-07-24 NOTE — Telephone Encounter (Signed)
Please advise AEX 06/18/13, Oxycodone last given #30/0rf's; Ketorolac #20/0rf's 06/18/13.

## 2013-07-27 ENCOUNTER — Encounter: Payer: Self-pay | Admitting: Obstetrics and Gynecology

## 2013-07-27 ENCOUNTER — Ambulatory Visit (INDEPENDENT_AMBULATORY_CARE_PROVIDER_SITE_OTHER): Payer: BC Managed Care – PPO | Admitting: Obstetrics and Gynecology

## 2013-07-27 VITALS — BP 104/62 | HR 60 | Resp 16 | Ht 59.0 in | Wt 99.0 lb

## 2013-07-27 DIAGNOSIS — M25559 Pain in unspecified hip: Secondary | ICD-10-CM

## 2013-07-27 MED ORDER — TRAMADOL HCL 50 MG PO TABS: 50.0000 mg | ORAL_TABLET | Freq: Four times a day (QID) | ORAL | Status: DC | PRN

## 2013-07-27 MED ORDER — NORELGESTROMIN-ETH ESTRADIOL 150-35 MCG/24HR TD PTWK: 1.0000 | MEDICATED_PATCH | TRANSDERMAL | Status: DC

## 2013-07-27 NOTE — Progress Notes (Signed)
Patient ID: Sheri Cameron, female   DOB: 12-Mar-1981, 32 y.o.   MRN: [id number]  Subjective  LMP -  June, using NuvaRing continuously for 6 weeks.  Having cramping, pain, nausea.  Pain is not daily.  Pain usually comes usually when she would expect her menses, which patient reports is twice per month.  Pain is always lower abdominal bilateral.    OCPs and Depo Provera have not regulated patient's menses in the past.    Has had NuvaRing in since June 2014.  Uncertain if she is having benefit of the Ring.  This is the second week of the second ring.   Occasionally has spotting.    No fevers.  No dysuria.  No painful bowel movements.   Cannot have laparoscopy and excision of uterine septum until November 2014.   Has a history of peptic ulcer.  Ran out of oxycodone.  Objective  General - smiling.  Abdominal exam   Soft upper abdomen.  No hepatosplenomegaly or organomegaly. Lower abdomen - patient is doing guarding.  No rebound.   Pelvic exam   Ring is protruding through the hymen and it does not push back well into the vagina - seems to big for the patient.  Normal external genitalia and urethra.   Cervix and vagina no lesions. Uterus small and mildly tender. No adnexal masses or tenderness.  Assessment  Pelvic pain and dysmenorrhea. NuvaRing not successful and seems too large for the patient.  Known uterine septum. History of gastic ulcer.   Plan  Stop NuvaRing. Start [place] continuously.  Discussed risks of thromboembolic events.  See Epic orders.  Rx Ultram.   See Epic orders.  Patient will return for a preop visit and prn.

## 2013-07-27 NOTE — Patient Instructions (Signed)
Tramadol tablets What is this medicine? TRAMADOL (TRA ma dole) is a pain reliever. It is used to treat moderate to severe pain in adults. This medicine may be used for other purposes; ask your health care provider or pharmacist if you have questions. What should I tell my health care provider before I take this medicine? They need to know if you have any of these conditions: -brain tumor -depression -drug abuse or addiction -head injury -if you frequently drink alcohol containing drinks -kidney disease or trouble passing urine -liver disease -lung disease, asthma, or breathing problems -seizures or epilepsy -suicidal thoughts, plans, or attempt; a previous suicide attempt by you or a family member -an unusual or allergic reaction to tramadol, codeine, other medicines, foods, dyes, or preservatives -pregnant or trying to get pregnant -breast-feeding How should I use this medicine? Take this medicine by mouth with a full glass of water. Follow the directions on the prescription label. If the medicine upsets your stomach, take it with food or milk. Do not take more medicine than you are told to take. Talk to your pediatrician regarding the use of this medicine in children. Special care may be needed. Overdosage: If you think you have taken too much of this medicine contact a poison control center or emergency room at once. NOTE: This medicine is only for you. Do not share this medicine with others. What if I miss a dose? If you miss a dose, take it as soon as you can. If it is almost time for your next dose, take only that dose. Do not take double or extra doses. What may interact with this medicine? Do not take this medicine with any of the following medications: -MAOIs like Carbex, Eldepryl, Marplan, Nardil, and Parnate This medicine may also interact with the following medications: -alcohol or medicines that contain alcohol -antihistamines -benzodiazepines -bupropion -carbamazepine  or oxcarbazepine -clozapine -cyclobenzaprine -digoxin -furazolidone -linezolid -medicines for depression, anxiety, or psychotic disturbances -medicines for migraine headache like almotriptan, eletriptan, frovatriptan, naratriptan, rizatriptan, sumatriptan, zolmitriptan -medicines for pain like pentazocine, buprenorphine, butorphanol, meperidine, nalbuphine, and propoxyphene -medicines for sleep -muscle relaxants -naltrexone -phenobarbital -phenothiazines like perphenazine, thioridazine, chlorpromazine, mesoridazine, fluphenazine, prochlorperazine, promazine, and trifluoperazine -procarbazine -warfarin This list may not describe all possible interactions. Give your health care provider a list of all the medicines, herbs, non-prescription drugs, or dietary supplements you use. Also tell them if you smoke, drink alcohol, or use illegal drugs. Some items may interact with your medicine. What should I watch for while using this medicine? Tell your doctor or health care professional if your pain does not go away, if it gets worse, or if you have new or a different type of pain. You may develop tolerance to the medicine. Tolerance means that you will need a higher dose of the medicine for pain relief. Tolerance is normal and is expected if you take this medicine for a long time. Do not suddenly stop taking your medicine because you may develop a severe reaction. Your body becomes used to the medicine. This does NOT mean you are addicted. Addiction is a behavior related to getting and using a drug for a non-medical reason. If you have pain, you have a medical reason to take pain medicine. Your doctor will tell you how much medicine to take. If your doctor wants you to stop the medicine, the dose will be slowly lowered over time to avoid any side effects. You may get drowsy or dizzy. Do not drive, use machinery, or do anything  that needs mental alertness until you know how this medicine affects you. Do  not stand or sit up quickly, especially if you are an older patient. This reduces the risk of dizzy or fainting spells. Alcohol can increase or decrease the effects of this medicine. Avoid alcoholic drinks. You may have constipation. Try to have a bowel movement at least every 2 to 3 days. If you do not have a bowel movement for 3 days, call your doctor or health care professional. Your mouth may get dry. Chewing sugarless gum or sucking hard candy, and drinking plenty of water may help. Contact your doctor if the problem does not go away or is severe. What side effects may I notice from receiving this medicine? Side effects that you should report to your doctor or health care professional as soon as possible: -allergic reactions like skin rash, itching or hives, swelling of the face, lips, or tongue -breathing difficulties, wheezing -confusion -itching -light headedness or fainting spells -redness, blistering, peeling or loosening of the skin, including inside the mouth -seizures Side effects that usually do not require medical attention (report to your doctor or health care professional if they continue or are bothersome): -constipation -dizziness -drowsiness -headache -nausea, vomiting This list may not describe all possible side effects. Call your doctor for medical advice about side effects. You may report side effects to FDA at 1-800-FDA-1088. Where should I keep my medicine? Keep out of the reach of children. Store at room temperature between 15 and 30 degrees C (59 and 86 degrees F). Keep container tightly closed. Throw away any unused medicine after the expiration date. NOTE: This sheet is a summary. It may not cover all possible information. If you have questions about this medicine, talk to your doctor, pharmacist, or health care provider.  2012, [employer] Standard. (08/02/2010 11:55:44 AM)

## 2013-08-21 ENCOUNTER — Telehealth: Payer: Self-pay | Admitting: *Deleted

## 2013-08-21 NOTE — Telephone Encounter (Signed)
Spoke with patient about her upcoming surgery, went over surgery information form. Pre op appt scheduled, Post op Appointments Scheduled . A copy of surgery info form was mailed to pt.

## 2013-08-26 ENCOUNTER — Encounter: Payer: Self-pay | Admitting: Obstetrics and Gynecology

## 2013-08-26 ENCOUNTER — Ambulatory Visit (INDEPENDENT_AMBULATORY_CARE_PROVIDER_SITE_OTHER): Payer: BC Managed Care – PPO | Admitting: Obstetrics and Gynecology

## 2013-08-26 VITALS — BP 100/56 | HR 80 | Ht 59.0 in | Wt 104.0 lb

## 2013-08-26 DIAGNOSIS — B373 Candidiasis of vulva and vagina: Secondary | ICD-10-CM

## 2013-08-26 MED ORDER — FLUCONAZOLE 150 MG PO TABS: 150.0000 mg | ORAL_TABLET | Freq: Once | ORAL | Status: DC

## 2013-08-26 MED ORDER — NYSTATIN-TRIAMCINOLONE 100000-0.1 UNIT/GM-% EX CREA: TOPICAL_CREAM | Freq: Two times a day (BID) | CUTANEOUS | Status: DC

## 2013-08-26 NOTE — Patient Instructions (Signed)
Monilial Vaginitis  Vaginitis in a soreness, swelling and redness (inflammation) of the vagina and vulva. Monilial vaginitis is not a sexually transmitted infection.  CAUSES   Yeast vaginitis is caused by yeast (candida) that is normally found in your vagina. With a yeast infection, the candida has overgrown in number to a point that upsets the chemical balance.  SYMPTOMS   · White, thick vaginal discharge.  · Swelling, itching, redness and irritation of the vagina and possibly the lips of the vagina (vulva).  · Burning or painful urination.  · Painful intercourse.  DIAGNOSIS   Things that may contribute to monilial vaginitis are:  · Postmenopausal and virginal states.  · Pregnancy.  · Infections.  · Being tired, sick or stressed, especially if you had monilial vaginitis in the past.  · Diabetes. Good control will help lower the chance.  · Birth control pills.  · Tight fitting garments.  · Using bubble bath, feminine sprays, douches or deodorant tampons.  · Taking certain medications that kill germs (antibiotics).  · Sporadic recurrence can occur if you become ill.  TREATMENT   Your caregiver will give you medication.  · There are several kinds of anti monilial vaginal creams and suppositories specific for monilial vaginitis. For recurrent yeast infections, use a suppository or cream in the vagina 2 times a week, or as directed.  · Anti-monilial or steroid cream for the itching or irritation of the vulva may also be used. Get your caregiver's permission.  · Painting the vagina with methylene blue solution may help if the monilial cream does not work.  · Eating yogurt may help prevent monilial vaginitis.  HOME CARE INSTRUCTIONS   · Finish all medication as prescribed.  · Do not have sex until treatment is completed or after your caregiver tells you it is okay.  · Take warm sitz baths.  · Do not douche.  · Do not use tampons, especially scented ones.  · Wear cotton underwear.  · Avoid tight pants and panty  hose.  · Tell your sexual partner that you have a yeast infection. They should go to their caregiver if they have symptoms such as mild rash or itching.  · Your sexual partner should be treated as well if your infection is difficult to eliminate.  · Practice safer sex. Use condoms.  · Some vaginal medications cause latex condoms to fail. Vaginal medications that harm condoms are:  · Cleocin cream.  · Butoconazole (Femstat®).  · Terconazole (Terazol®) vaginal suppository.  · Miconazole (Monistat®) (may be purchased over the counter).  SEEK MEDICAL CARE IF:   · You have a temperature by mouth above 102° F (38.9° C).  · The infection is getting worse after 2 days of treatment.  · The infection is not getting better after 3 days of treatment.  · You develop blisters in or around your vagina.  · You develop vaginal bleeding, and it is not your menstrual period.  · You have pain when you urinate.  · You develop intestinal problems.  · You have pain with sexual intercourse.  Document Released: 08/29/2005 Document Revised: 02/11/2012 Document Reviewed: 05/13/2009  Exit[employer]® Patient Information ©2014 [employer].

## 2013-08-26 NOTE — Progress Notes (Signed)
Patient ID: Sheri Cameron, female   DOB: 12-13-1980, 32 y.o.   MRN: [id number]  Subjective  Patient on Ortho Evra continuously. Doing much better but still has pain, more on the left than the right.  Can have episodes of pain in the LLQ that last 15 minutes, stabbing in nature, not related to anything at all. Patches staying on well. Surgery scheduled for November for dysmenorrhea.    States having vagina discharge green.   Some slight odor and itching.  No over the counter remedies.  No recent antibiotics. Changed body wash - using Dove sensitive skin, but then noticed the discharge.   Feels stressed. Two recent deaths in the family, maternal aunt from metastatic uterine cancer and paternal grandmother from MI complications.  Objective  Vulva - no lesions. Cervix and vagina - no lesions.  Green clumpy discharge. Uterus small and nontender.  No adnexal masses.  Left adnexal tenderness.  Wet prep - pH 4.5, positive for hyphae, negative for clue cells and trichomonas.  Assessment  Yeast vaginitis. Pelvic pain/dysmenorrhea - improved on continuous Ortho Evra.  Uterine septum.  Plan   Diflucan.  See orders. Mycolog II.  See orders. Continue with Ortho Evra. Patient is scheduled to have laparoscopy and excision of uterine septum in November.

## 2013-09-08 ENCOUNTER — Inpatient Hospital Stay (HOSPITAL_COMMUNITY)
Admission: AD | Admit: 2013-09-08 | Discharge: 2013-09-08 | Disposition: A | Discharge status: Home / Self Care | Payer: BC Managed Care – PPO | Source: Ambulatory Visit | Attending: Obstetrics & Gynecology | Admitting: Obstetrics & Gynecology

## 2013-09-08 ENCOUNTER — Inpatient Hospital Stay (HOSPITAL_COMMUNITY): Payer: BC Managed Care – PPO

## 2013-09-08 ENCOUNTER — Encounter (HOSPITAL_COMMUNITY): Payer: Self-pay | Admitting: General Practice

## 2013-09-08 DIAGNOSIS — G8929 Other chronic pain: Secondary | ICD-10-CM | POA: Insufficient documentation

## 2013-09-08 DIAGNOSIS — N946 Dysmenorrhea, unspecified: Secondary | ICD-10-CM | POA: Insufficient documentation

## 2013-09-08 DIAGNOSIS — R109 Unspecified abdominal pain: Secondary | ICD-10-CM | POA: Insufficient documentation

## 2013-09-08 DIAGNOSIS — R197 Diarrhea, unspecified: Secondary | ICD-10-CM | POA: Insufficient documentation

## 2013-09-08 DIAGNOSIS — N949 Unspecified condition associated with female genital organs and menstrual cycle: Secondary | ICD-10-CM | POA: Insufficient documentation

## 2013-09-08 DIAGNOSIS — K529 Noninfective gastroenteritis and colitis, unspecified: Secondary | ICD-10-CM

## 2013-09-08 LAB — CBC
MCH: 28.3 pg (ref 26.0–34.0)
Platelets: 449 10*3/uL — ABNORMAL HIGH (ref 150–400)
RBC: 4.77 MIL/uL (ref 3.87–5.11)
RDW: 13.6 % (ref 11.5–15.5)
WBC: 11.1 10*3/uL — ABNORMAL HIGH (ref 4.0–10.5)

## 2013-09-08 LAB — WET PREP, GENITAL: Yeast Wet Prep HPF POC: NONE SEEN

## 2013-09-08 LAB — URINALYSIS, ROUTINE W REFLEX MICROSCOPIC
Leukocytes, UA: NEGATIVE
Nitrite: NEGATIVE
Specific Gravity, Urine: 1.01 (ref 1.005–1.030)
Urobilinogen, UA: 0.2 mg/dL (ref 0.0–1.0)
pH: 6 (ref 5.0–8.0)

## 2013-09-08 MED ORDER — KETOROLAC TROMETHAMINE 60 MG/2ML IM SOLN
60.0000 mg | Freq: Once | INTRAMUSCULAR | Status: AC
  Administered 2013-09-08: 60 mg via INTRAMUSCULAR
  Filled 2013-09-08: qty 2

## 2013-09-08 MED ORDER — ONDANSETRON HCL 4 MG PO TABS: 4.0000 mg | ORAL_TABLET | Freq: Once | ORAL | Status: DC | PRN

## 2013-09-08 MED ORDER — OXYCODONE-ACETAMINOPHEN 5-325 MG PO TABS: 1.0000 | ORAL_TABLET | ORAL | Status: DC | PRN

## 2013-09-08 MED ORDER — KETOROLAC TROMETHAMINE 10 MG PO TABS: 10.0000 mg | ORAL_TABLET | Freq: Four times a day (QID) | ORAL | Status: DC | PRN

## 2013-09-08 MED ORDER — DICYCLOMINE HCL 10 MG PO CAPS: 10.0000 mg | ORAL_CAPSULE | Freq: Three times a day (TID) | ORAL | Status: DC

## 2013-09-08 MED ORDER — DICYCLOMINE HCL 10 MG PO CAPS
20.0000 mg | ORAL_CAPSULE | Freq: Once | ORAL | Status: AC
  Administered 2013-09-08: 20 mg via ORAL
  Filled 2013-09-08: qty 2

## 2013-09-08 MED ORDER — OXYCODONE-ACETAMINOPHEN 5-325 MG PO TABS
1.0000 | ORAL_TABLET | Freq: Once | ORAL | Status: AC
  Administered 2013-09-08: 1 via ORAL
  Filled 2013-09-08: qty 1

## 2013-09-08 NOTE — MAU Note (Signed)
Patient is in with c/o chronic but more intense lower abdominal pain. More on LLQ. She also c/o green vaginal discharge. She states that she is taking flagyl and tramadol but have no relief. Patient states that she just started bleeding.

## 2013-09-08 NOTE — MAU Provider Note (Signed)
History     CSN: [id number]  Arrival date and time: 09/08/13 1751   First Provider Initiated Contact with Patient 09/08/13 1855      Chief Complaint  Patient presents with  . Abdominal Pain  . Vaginal Discharge   HPI  [name] is a 32 y.o. female who presents with abdominal pain and vaginal discharge. The pain is in her lower abdomen, bilateral; sharp in nature. The pain started this morning; she has taken tramadol with minimal relief, she rates her pain 8/10.  She describes the vaginal discharge as green and thick, no new sexual partners. Pt is scheduled for surgery November 4th with Dr. 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   OB History   Grav Para Term Preterm Abortions TAB SAB Ect Mult Living   2    2  2          Past Medical History  Diagnosis Date  . Anxiety   . Anemia   . Depression   . Dyspareunia   . Gastric ulcer     Past Surgical History  Procedure Laterality Date  . Appendectomy    . Cholecystectomy      Family History  Problem Relation Age of Onset  . Cancer Father     cholangiocarcinoma  . Breast cancer Paternal Grandmother   . Hypertension Paternal Grandmother   . Diabetes Paternal Grandmother   . Cancer Paternal Grandfather     cholangiocarcinoma/liver ca  . Cancer Maternal Grandfather     pancreatic cancer  . Ovarian cancer Maternal Aunt   . Hypertension Maternal Grandmother   . Diabetes Maternal Grandmother     History  Substance Use Topics  . Smoking status: Never Smoker   . Smokeless tobacco: Not on file  . Alcohol Use: No    Allergies:  Allergies  Allergen Reactions  . Augmentin [Amoxicillin-Pot Clavulanate] Hives  . Erythromycin Hives    Prescriptions prior to admission  Medication Sig Dispense Refill  . fluconazole (DIFLUCAN) 150 MG tablet Take 1 tablet (150 mg total) by mouth once. Take one tablet.  Repeat in 48 hours if symptoms are not completely  resolved.  2 tablet  0  . IBUPROFEN PO Take by mouth as needed.      Marland Kitchen ketorolac (TORADOL) 10 MG tablet Take 1 tablet (10 mg total) by mouth every 6 (six) hours as needed for pain.  20 tablet  0  . norelgestromin-ethinyl estradiol (ORTHO EVRA) 150-35 MCG/24HR transdermal patch Place 1 patch onto the skin once a week.  12 patch  0  . nystatin-triamcinolone (MYCOLOG II) cream Apply topically 2 (two) times daily. Apply to affected area BID for up to 7 days.  60 g  0  . ondansetron (ZOFRAN ODT) 8 MG disintegrating tablet Take 1 tablet (8 mg total) by mouth every 8 (eight) hours as needed for nausea.  20 tablet  2  . sertraline (ZOLOFT) 25 MG tablet Take 50 mg by mouth at bedtime. Take 2 tabs daily      . traMADol (ULTRAM) 50 MG tablet Take 1 tablet (50 mg total) by mouth every 6 (six) hours as needed for pain.  30 tablet  0  . traZODone (DESYREL) 100 MG tablet Take 50 mg by mouth at bedtime. 1/2 to 2 tablets at bedtime       Results for orders placed during the hospital encounter of 09/08/13 (from the past 24 hour(s))  URINALYSIS,  ROUTINE W REFLEX MICROSCOPIC     Status: None   Collection Time    09/08/13  6:05 PM      Result Value Range   Color, Urine YELLOW  YELLOW   APPearance CLEAR  CLEAR   Specific Gravity, Urine 1.010  1.005 - 1.030   pH 6.0  5.0 - 8.0   Glucose, UA NEGATIVE  NEGATIVE mg/dL   Hgb urine dipstick NEGATIVE  NEGATIVE   Bilirubin Urine NEGATIVE  NEGATIVE   Ketones, ur NEGATIVE  NEGATIVE mg/dL   Protein, ur NEGATIVE  NEGATIVE mg/dL   Urobilinogen, UA 0.2  0.0 - 1.0 mg/dL   Nitrite NEGATIVE  NEGATIVE   Leukocytes, UA NEGATIVE  NEGATIVE  POCT PREGNANCY, URINE     Status: None   Collection Time    09/08/13  6:11 PM      Result Value Range   Preg Test, Ur NEGATIVE  NEGATIVE   Review of Systems  Constitutional: Negative for fever and chills.  Gastrointestinal: Positive for nausea, abdominal pain and diarrhea. Negative for vomiting.       Two episodes of diarrhea today.   Genitourinary: Positive for dysuria, urgency and frequency.       + vaginal discharge. + vaginal bleeding; brown  No dysuria.   Neurological: Negative for dizziness.   Physical Exam   Blood pressure 124/75, pulse 75, temperature 98.2 F (36.8 C), temperature source Oral, resp. rate 18, height 4\' 11"  (1.499 m), weight 104 lb (47.174 kg), SpO2 100.00%.  Physical Exam  MAU Course  Procedures  MDM UA Upt CBC  Wet prep GC/Chlamydia   Assessment and Plan  Report given to Deirdre Poe CNM, who resumes care of this patient.   RASCH, JENNIFER IRENE FNP-C 09/08/2013, 7:09 PM   Assumed care at 1920  SUBJECTIVE: 32 year old G2 P0020 described sharp stabbing left greater than right sided lower abdominal pain which is very similar to the pain she was having on 08/26/2013 when she saw Dr. Edward Jolly for her chronic pelvic pain. She also reported green vaginal discharge at that time and was given Diflucan for presumptive yeast treatment when wet prep was negative. Has had no improvement in the vaginal discharge. STD panel and GC/ chlamydia were negative July 2014 and she declines retest today. She tried an Ultram this morning but the pain was severe enough for her to leave work and she requests a work excuse. She is on continuous Ortho Evra but had 2 days without the patch over the weekend. She is scheduled for laparoscopic surgery 4 dysmenorrhea and excision of uterine septum on 09/05/2013.  OBJECTIVE: Gen: WN WD in apparent moderate distress Abdomen: soft,flat, ND with TTP LLQ and suprapubic region. Voluntary guarding, no rebound. No localized tenderness at McBurney's point.  Speculum exam: NEFG. Tender at introitus with insertion of speculum. Discharge appears physiologic cervix clean with scant mucus white discharge. Bimanual: cervix mobile, nontender; generalized tenderness suprapubically and left greater than right adnexal region. Difficult to size uterus due to voluntary guarding.  MAU  course:  Toradol 60mg  IM given Results for orders placed during the hospital encounter of 09/08/13 (from the past 24 hour(s))  URINALYSIS, ROUTINE W REFLEX MICROSCOPIC     Status: None   Collection Time    09/08/13  6:05 PM      Result Value Range   Color, Urine YELLOW  YELLOW   APPearance CLEAR  CLEAR   Specific Gravity, Urine 1.010  1.005 - 1.030   pH 6.0  5.0 -  8.0   Glucose, UA NEGATIVE  NEGATIVE mg/dL   Hgb urine dipstick NEGATIVE  NEGATIVE   Bilirubin Urine NEGATIVE  NEGATIVE   Ketones, ur NEGATIVE  NEGATIVE mg/dL   Protein, ur NEGATIVE  NEGATIVE mg/dL   Urobilinogen, UA 0.2  0.0 - 1.0 mg/dL   Nitrite NEGATIVE  NEGATIVE   Leukocytes, UA NEGATIVE  NEGATIVE  POCT PREGNANCY, URINE     Status: None   Collection Time    09/08/13  6:11 PM      Result Value Range   Preg Test, Ur NEGATIVE  NEGATIVE  CBC     Status: Abnormal   Collection Time    09/08/13  7:05 PM      Result Value Range   WBC 11.1 (*) 4.0 - 10.5 K/uL   RBC 4.77  3.87 - 5.11 MIL/uL   Hemoglobin 13.5  12.0 - 15.0 g/dL   HCT 40.9  81.1 - 91.4 %   MCV 85.5  78.0 - 100.0 fL   MCH 28.3  26.0 - 34.0 pg   MCHC 33.1  30.0 - 36.0 g/dL   RDW 78.2  95.6 - 21.3 %   Platelets 449 (*) 150 - 400 K/uL  WET PREP, GENITAL     Status: Abnormal   Collection Time    09/08/13  7:20 PM      Result Value Range   Yeast Wet Prep HPF POC NONE SEEN  NONE SEEN   Trich, Wet Prep NONE SEEN  NONE SEEN   Clue Cells Wet Prep HPF POC NONE SEEN  NONE SEEN   WBC, Wet Prep HPF POC FEW (*) NONE SEEN   2000: D/W Dr. Hyacinth Meeker: Will do pelvic US   8:09 PM  [place] CNM assumed care at 2010.  Danae Orleans, CNM 09/08/2013 8:10 PM  Assumed care of pt at 2010. Pt waiting for [place].   2110: Pain decreased slightly after Toradol, but back to 7/10 after discomfort of [place]. Reviewed all results w/ pt. Suspect exacerbation of pain may be due either to being off OrthoEvra x 2 days or GI related. Pt reports episodes of diet- related diarrhea since  cholecystectomy. Discussed foods to avoid. Suggested trying Bentyl in case pains are intestinal spasms. Strongly recommended diet/pain/GI log. Will give Bentyl and 1 Percocet in MAU and send home w/ Rx per consult with Dr. Hyacinth Meeker.   ASSESSMENT: 1. Dysmenorrhea   2. Chronic pelvic pain in female   3. Non-infective diarrhea    PLAN: D/C home stable condition per consult with Dr. Hyacinth Meeker. Keep diet/pain/GI log. Work note given for today. Follow-up Information   Follow up with Conley Simmonds, MD. (As needed if symptoms worsen)    Specialty:  Obstetrics and Gynecology   Contact information:   [address] [place] [address] [phone]        Medication List         dicyclomine 10 MG capsule  Commonly known as:  BENTYL  Take 1-2 capsules (10-20 mg total) by mouth 4 (four) times daily -  before meals and at bedtime.     fluconazole 150 MG tablet  Commonly known as:  DIFLUCAN  Take 1 tablet (150 mg total) by mouth once. Take one tablet.  Repeat in 48 hours if symptoms are not completely resolved.     ketorolac 10 MG tablet  Commonly known as:  TORADOL  Take 1 tablet (10 mg total) by mouth every 6 (six) hours as needed  for pain.     norelgestromin-ethinyl estradiol 150-35 MCG/24HR transdermal patch  Commonly known as:  ORTHO EVRA  Place 1 patch onto the skin once a week.     nystatin-triamcinolone cream  Commonly known as:  MYCOLOG II  Apply topically 2 (two) times daily. Apply to affected area BID for up to 7 days.     oxyCODONE-acetaminophen 5-325 MG per tablet  Commonly known as:  PERCOCET/ROXICET  Take 1 tablet by mouth every 4 (four) hours as needed for pain.     sertraline 25 MG tablet  Commonly known as:  ZOLOFT  Take 50 mg by mouth at bedtime. Take 2 tabs daily     traMADol 50 MG tablet  Commonly known as:  ULTRAM  Take 1 tablet (50 mg total) by mouth every 6 (six) hours as needed for pain.     traZODone 100 MG tablet  Commonly known as:   DESYREL  Take 50 mg by mouth at bedtime. 1/2 to 2 tablets at bedtime       Dorathy Kinsman, [place] 09/08/2013 10:07 PM

## 2013-09-11 ENCOUNTER — Other Ambulatory Visit: Payer: Self-pay | Admitting: Obstetrics & Gynecology

## 2013-09-15 NOTE — MAU Provider Note (Signed)
I am cosigning this noted which I have reviewed. The patient is currently under my care for dysmenorrhea, contraception, and a mullerian anomaly. I was not contacted during her visit to the hospital on 09/08/13, so it was reviewed by me after the patient rendered care and was discharged to home.

## 2013-09-16 ENCOUNTER — Ambulatory Visit (INDEPENDENT_AMBULATORY_CARE_PROVIDER_SITE_OTHER): Payer: BC Managed Care – PPO | Admitting: Obstetrics and Gynecology

## 2013-09-16 ENCOUNTER — Encounter: Payer: Self-pay | Admitting: Obstetrics and Gynecology

## 2013-09-16 VITALS — BP 102/70 | HR 80 | Resp 20 | Wt 104.0 lb

## 2013-09-16 DIAGNOSIS — Q519 Congenital malformation of uterus and cervix, unspecified: Secondary | ICD-10-CM

## 2013-09-16 DIAGNOSIS — Q51818 Other congenital malformations of uterus: Secondary | ICD-10-CM

## 2013-09-16 DIAGNOSIS — N946 Dysmenorrhea, unspecified: Secondary | ICD-10-CM

## 2013-09-16 MED ORDER — NORELGESTROMIN-ETH ESTRADIOL 150-35 MCG/24HR TD PTWK: 1.0000 | MEDICATED_PATCH | TRANSDERMAL | Status: DC

## 2013-09-16 NOTE — Progress Notes (Signed)
Patient ID: Sheri Cameron, female   DOB: 10/04/1981, 32 y.o.   MRN: [id number]  GYNECOLOGY PROBLEM VISIT  HPI: 32 y.o.   Single  Caucasian  female   G2P0020 with Patient's last menstrual period was 06/02/2013.   here for surgical discussion.  Patients mother is present for discussion and patient's examination.   Patient has a history of incapacitating dysmenorrhea and SAB.  Office ultrasound noted a septate uterus.  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   Patient has had a trial of OCPs, Nuva Ring, Ortho Evra, narcotics, and Tramadol to control her pain.  Ortho Evra working best in continuous fashion, but recently patient had patch fall off which resulted in tremendous pain and spotting.  She went to the Emergency Department at [facility] and had a normal pelvic ultrasound.  Patient has had previously negative GC/CT testing.   In 2012 had EGD/Colonoscopy due to abdominal pain. Found to have E.Coli and gastric ulcer.Lissa Hoard, [place]) - patient had this infection after was in [place].   Patient ready to proceed with hysteroscopic resection of septum and laparoscopy with possible treatment of endometriosis.  Has been waiting for insurance benefits in order to proceed with surgery.   GYNECOLOGIC HISTORY: Patient's last menstrual period was 06/02/2013. Sexually active:  Yes. Partner preference:   Female. Contraception:   Ortho Evra - continuous. 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 Mammogram:  NA          Pap: 06/04/13 - normal.      OB History   Grav Para Term Preterm Abortions TAB SAB Ect Mult Living   2    2  2             Family History  Problem Relation Age of Onset  . Cancer Father     cholangiocarcinoma  . Breast cancer Paternal Grandmother   .  Hypertension Paternal Grandmother   . Diabetes Paternal Grandmother   . Cancer Paternal Grandfather     cholangiocarcinoma/liver ca  . Cancer Maternal Grandfather     pancreatic cancer  . Ovarian cancer Maternal Aunt   . Hypertension Maternal Grandmother   . Diabetes Maternal Grandmother     Patient Active Problem List   Diagnosis Date Noted  . Dysmenorrhea 06/25/2013    Past Medical History  Diagnosis Date  . Anxiety   . Anemia   . Depression   . Dyspareunia   . Gastric ulcer   . Dysmenorrhea     Past Surgical History  Procedure Laterality Date  . Appendectomy    . Cholecystectomy      ALLERGIES: Augmentin and Erythromycin  Current Outpatient Prescriptions  Medication Sig Dispense Refill  . dicyclomine (BENTYL) 10 MG capsule Take 1-2 capsules (10-20 mg total) by mouth 4 (four) times daily -  before meals and at bedtime.  30 capsule  1  . ketorolac (TORADOL) 10 MG tablet Take 1 tablet (10 mg total) by mouth every 6 (six) hours as needed for pain.  20 tablet  0  . norelgestromin-ethinyl estradiol (ORTHO EVRA) 150-35 MCG/24HR transdermal patch Place 1 patch onto the skin once a week.  12 patch  1  . oxyCODONE-acetaminophen (PERCOCET/ROXICET) 5-325 MG per tablet Take 1 tablet by mouth every 4 (four) hours as needed for  pain.  20 tablet  0  . sertraline (ZOLOFT) 25 MG tablet Take 50 mg by mouth at bedtime. Take 2 tabs daily      . traMADol (ULTRAM) 50 MG tablet Take 1 tablet (50 mg total) by mouth every 6 (six) hours as needed for pain.  30 tablet  0  . traZODone (DESYREL) 100 MG tablet Take 50 mg by mouth at bedtime. 1/2 to 2 tablets at bedtime      . fluconazole (DIFLUCAN) 150 MG tablet Take 1 tablet (150 mg total) by mouth once. Take one tablet.  Repeat in 48 hours if symptoms are not completely resolved.  2 tablet  0  . nystatin-triamcinolone (MYCOLOG II) cream Apply topically 2 (two) times daily. Apply to affected area BID for up to 7 days.  60 g  0   No current  facility-administered medications for this visit.     ROS:  Pertinent items are noted in HPI.  SOCIAL HISTORY:  Engaged.    PHYSICAL EXAMINATION:    BP 102/70  Pulse 80  Resp 20  Wt 104 lb (47.174 kg)  BMI 20.99 kg/m2  LMP 06/02/2013   Wt Readings from Last 3 Encounters:  09/16/13 104 lb (47.174 kg)  09/08/13 104 lb (47.174 kg)  08/26/13 104 lb (47.174 kg)     Ht Readings from Last 3 Encounters:  09/08/13 4\' 11"  (1.499 m)  08/26/13 4\' 11"  (1.499 m)  07/27/13 4\' 11"  (1.499 m)    General appearance: alert, cooperative and appears stated age Head: Normocephalic, without obvious abnormality, atraumatic Neck: no adenopathy, supple, symmetrical, trachea midline and thyroid not enlarged, symmetric, no tenderness/mass/nodules Lungs: clear to auscultation bilaterally Breasts: Inspection negative, No nipple retraction or dimpling, No nipple discharge or bleeding, No axillary or supraclavicular adenopathy, Normal to palpation without dominant masses Heart: regular rate and rhythm Abdomen: soft,  Mildly tender in left lower quadrant; no masses,  no organomegaly Extremities: extremities normal, atraumatic, no cyanosis or edema Skin: Skin color, texture, turgor normal. No rashes or lesions Lymph nodes: Cervical, supraclavicular, and axillary nodes normal. No abnormal inguinal nodes palpated Neurologic: Grossly normal  Pelvic: External genitalia:  no lesions              Urethra:  normal appearing urethra with no masses, tenderness or lesions              Bartholins and Skenes: normal                 Vagina: normal appearing vagina with normal color and discharge, no lesions              Cervix: normal appearance                  Bimanual Exam:  Uterus:  uterus is normal size, shape, consistency and mildly tender                                      Adnexa: normal adnexa in size, nontender and no masses                                      Rectovaginal: Confirms  Anus:  normal sphincter tone, no lesions  ASSESSMENT  Dysmenorrhea. Thin uterine septum. History of LEEP. History of SAB. History of gastric ulcer.   PLAN  Proceed with metroplasty and laparoscopy with possible treatment of endometriosis and adhesive disease.  Benefits and risks of the procedure discussed. We discussed surgical repair with a hysteroscopic resection of the septal wall with laparoscopic guidance. This will also allow an opportunity for diagnosis and treatment of endometriosis or pelvic adhesions. We discussed that the laparoscopy may not reveal the source of her pain and that the septoplasty would not resolve her pain but would simply unify the cavity and reduce risk of future miscarriage and preterm labor.  Other discussed risks of the procedure include but are not limited to bleeding, infection, trauma to surrounding organs, DVT, PE, reaction to  Anesthesia, death, hyponatremia, pulmonary edema, uterine perforation, heavy uterine bleeding requiring Foley balloon treatment and hospital observation, development of adhesive disease intraperitoneally or in the uterine cavity, and incompleteness of the procedure.   The patient and her mother indicate understanding, and the patient wishes to proceed.   I instructed the patient to continue with the Ortho Evra continuously and not stop this prior to surgery.  I believe the risks of removal outweigh the benefit, and I do not want the patient to have a menstrual cycle when I will need to be proceeding with resection of the uterine septum.  Patient will take magnesium citrate starting at noon the day prior to her surgery and do clear liquids the day prior to surgery.  NPO after midnight.   An After Visit Summary was printed and given to the patient.

## 2013-09-16 NOTE — Patient Instructions (Signed)
Magnesium Citrate oral solution What is this medicine? MAGNESIUM CITRATE (mag NEE zee um SI treyt) is a saline laxative. It is used to treat occasional constipation, but it should not be used regularly for this purpose. This medicine may be used for other purposes; ask your health care provider or pharmacist if you have questions. What should I tell my health care provider before I take this medicine? They need to know if you have any of these conditions: -are on a low magnesium or low sodium diet -change in bowel habits for 2 weeks -colostomy or ileostomy -constipation after using another laxative for 7 days -diabetes -kidney disease -rectal bleeding -stomach pain, nausea, or vomiting -an unusual or allergic reaction to magnesium citrate, other magnesium products, other medicines, foods, dyes, or preservatives -pregnant or trying to get pregnant -breast-feeding How should I use this medicine? Take this medicine by mouth. Follow the directions on the package or prescription label. Use a specially marked spoon or container to measure each dose. Ask your pharmacist if you do not have one. Household spoons are not accurate. Drink a full glass of fluid with each dose of this medicine. This medicine may taste better if it is chilled before you drink it. Do not take your medicine more often than directed. Talk to your pediatrician regarding the use of this medicine in children. While this drug may be prescribed for children as young as 73 years of age for selected conditions, precautions do apply. Overdosage: If you think you have taken too much of this medicine contact a poison control center or emergency room at once. NOTE: This medicine is only for you. Do not share this medicine with others. What if I miss a dose? This does not apply; this medicine is not for regular use. What may interact with this medicine? -cellulose sodium phosphate -digoxin -edetate disodium, EDTA -medicines for bone  strength like etidronate, ibandronate, risedronate -sodium polystyrene sulfonate -some antibiotics like ciprofloxacin, doxycycline, gatifloxacin, levofloxacin, tetracycline -vitamin D This list may not describe all possible interactions. Give your health care provider a list of all the medicines, herbs, non-prescription drugs, or dietary supplements you use. Also tell them if you smoke, drink alcohol, or use illegal drugs. Some items may interact with your medicine. What should I watch for while using this medicine? Tell your doctor or healthcare professional if your symptoms do not start to get better or if they get worse. Do not take any other medicine by mouth within 2 hours of taking this medicine. What side effects may I notice from receiving this medicine? Side effects that you should report to your doctor or health care professional as soon as possible: -allergic reactions like skin rash, itching or hives, swelling of the face, lips, or tongue -breathing problems -chest pain -fast, irregular heartbeat -muscle weakness -nausea or vomiting Side effects that usually do not require medical attention (report to your doctor or health care professional if they continue or are bothersome): -diarrhea -stomach upset This list may not describe all possible side effects. Call your doctor for medical advice about side effects. You may report side effects to FDA at 1-800-FDA-1088. Where should I keep my medicine? Keep out of the reach of children. Store at room temperature or in the refrigerator between 8 and 30 degrees C (46 and 86 degrees F). Throw away any unused medicine 24 hours after opening the bottle. Throw away unopened bottles of medicine after the expiration date. NOTE: This sheet is a summary. It may not  cover all possible information. If you have questions about this medicine, talk to your doctor, pharmacist, or health care provider.  2012, Elsevier/Gold Standard. (06/08/2008 5:27:50  PM)

## 2013-09-17 ENCOUNTER — Telehealth: Payer: Self-pay | Admitting: Obstetrics and Gynecology

## 2013-09-17 NOTE — Telephone Encounter (Signed)
Note signed by dr Edward Jolly and patient notified that note ready for pickup.  She has talked to HR and forms will be mailed to her and she will bring them in as soon as she receives. Reminded 2 weeks/$25 and patient agreeable. Note will be scanned.  If no other needs, please sign and then close encounter.

## 2013-09-17 NOTE — Telephone Encounter (Signed)
Patient was seen yesterday for pre-op appointment and discussed with Dr Edward Jolly bowel prep  And time out of work.  Her supervisor requests note, separate from FMLA forms, to schedule patient out of work.  Discussed that this is unusual and that normally, copy of FMLA papers should be enough so will review with Dr Edward Jolly.  Patient is ware that two weeks are needed to complete FMLA forms and there is $25 fee.  She would like to pick up note ASAP and will get FMLA forms.  Note written and sent to your office to sign if agreeable.

## 2013-09-17 NOTE — Telephone Encounter (Signed)
Call to work number, unable to get thru on this number, fax tone X1 and then busy signal X2. Call to cell number and LMTCB.

## 2013-09-18 ENCOUNTER — Encounter (HOSPITAL_COMMUNITY): Payer: Self-pay | Admitting: Pharmacist

## 2013-09-23 ENCOUNTER — Encounter: Payer: Self-pay | Admitting: Obstetrics and Gynecology

## 2013-09-23 ENCOUNTER — Telehealth: Payer: Self-pay | Admitting: Obstetrics and Gynecology

## 2013-09-23 ENCOUNTER — Ambulatory Visit (INDEPENDENT_AMBULATORY_CARE_PROVIDER_SITE_OTHER): Payer: BC Managed Care – PPO | Admitting: Obstetrics and Gynecology

## 2013-09-23 ENCOUNTER — Other Ambulatory Visit: Payer: Self-pay | Admitting: Obstetrics and Gynecology

## 2013-09-23 VITALS — BP 110/68 | HR 81 | Temp 98.1°F | Resp 16 | Wt 105.0 lb

## 2013-09-23 DIAGNOSIS — N949 Unspecified condition associated with female genital organs and menstrual cycle: Secondary | ICD-10-CM

## 2013-09-23 DIAGNOSIS — R109 Unspecified abdominal pain: Secondary | ICD-10-CM

## 2013-09-23 DIAGNOSIS — M25559 Pain in unspecified hip: Secondary | ICD-10-CM

## 2013-09-23 DIAGNOSIS — N912 Amenorrhea, unspecified: Secondary | ICD-10-CM

## 2013-09-23 DIAGNOSIS — G8929 Other chronic pain: Secondary | ICD-10-CM | POA: Insufficient documentation

## 2013-09-23 LAB — CBC WITH DIFFERENTIAL/PLATELET
Basophils Absolute: 0.1 10*3/uL (ref 0.0–0.1)
Basophils Relative: 1 % (ref 0–1)
Lymphocytes Relative: 26 % (ref 12–46)
MCHC: 34.3 g/dL (ref 30.0–36.0)
Neutro Abs: 7.6 10*3/uL (ref 1.7–7.7)
Neutrophils Relative %: 63 % (ref 43–77)
Platelets: 443 10*3/uL — ABNORMAL HIGH (ref 150–400)
RDW: 14 % (ref 11.5–15.5)
WBC: 11.8 10*3/uL — ABNORMAL HIGH (ref 4.0–10.5)

## 2013-09-23 LAB — POCT URINALYSIS DIPSTICK
Bilirubin, UA: NEGATIVE
Leukocytes, UA: NEGATIVE
Nitrite, UA: NEGATIVE
Urobilinogen, UA: NEGATIVE
pH, UA: 5

## 2013-09-23 LAB — POCT URINE PREGNANCY: Preg Test, Ur: NEGATIVE

## 2013-09-23 MED ORDER — CEFTRIAXONE SODIUM 1 G IJ SOLR
250.0000 mg | Freq: Once | INTRAMUSCULAR | Status: AC
  Administered 2013-09-23: 250 mg via INTRAMUSCULAR

## 2013-09-23 MED ORDER — DOXYCYCLINE HYCLATE 100 MG PO CAPS: 100.0000 mg | ORAL_CAPSULE | Freq: Two times a day (BID) | ORAL | Status: DC

## 2013-09-23 NOTE — Telephone Encounter (Signed)
Patient is concerned is have having excruciating pain in ovary area. Said it shoots down her legs. She also is having a green discharge that is coming and going from a lot to a little. She is scheduled for surgery November 4th.

## 2013-09-23 NOTE — Patient Instructions (Signed)
Doxycycline tablets or capsules What is this medicine? DOXYCYCLINE (dox i SYE kleen) is a tetracycline antibiotic. It kills certain bacteria or stops their growth. It is used to treat many kinds of infections, like dental, skin, respiratory, and urinary tract infections. It also treats acne, Lyme disease, malaria, and certain sexually transmitted infections. This medicine may be used for other purposes; ask your health care provider or pharmacist if you have questions. What should I tell my health care provider before I take this medicine? They need to know if you have any of these conditions: -liver disease -long exposure to sunlight like working outdoors -stomach problems like colitis -an unusual or allergic reaction to doxycycline, tetracycline antibiotics, other medicines, foods, dyes, or preservatives -pregnant or trying to get pregnant -breast-feeding How should I use this medicine? Take this medicine by mouth with a full glass of water. Follow the directions on the prescription label. It is best to take this medicine without food, but if it upsets your stomach take it with food. Take your medicine at regular intervals. Do not take your medicine more often than directed. Take all of your medicine as directed even if you think you are better. Do not skip doses or stop your medicine early. Talk to your pediatrician regarding the use of this medicine in children. Special care may be needed. While this drug may be prescribed for children as young as 99 years old for selected conditions, precautions do apply. Overdosage: If you think you have taken too much of this medicine contact a poison control center or emergency room at once. NOTE: This medicine is only for you. Do not share this medicine with others. What if I miss a dose? If you miss a dose, take it as soon as you can. If it is almost time for your next dose, take only that dose. Do not take double or extra doses. What may interact with  this medicine? -antacids -barbiturates -birth control pills -bismuth subsalicylate -carbamazepine -methoxyflurane -other antibiotics -phenytoin -vitamins that contain iron -warfarin This list may not describe all possible interactions. Give your health care provider a list of all the medicines, herbs, non-prescription drugs, or dietary supplements you use. Also tell them if you smoke, drink alcohol, or use illegal drugs. Some items may interact with your medicine. What should I watch for while using this medicine? Tell your doctor or health care professional if your symptoms do not improve. Do not treat diarrhea with over the counter products. Contact your doctor if you have diarrhea that lasts more than 2 days or if it is severe and watery. Do not take this medicine just before going to bed. It may not dissolve properly when you lay down and can cause pain in your throat. Drink plenty of fluids while taking this medicine to also help reduce irritation in your throat. This medicine can make you more sensitive to the sun. Keep out of the sun. If you cannot avoid being in the sun, wear protective clothing and use sunscreen. Do not use sun lamps or tanning beds/booths. Birth control pills may not work properly while you are taking this medicine. Talk to your doctor about using an extra method of birth control. If you are being treated for a sexually transmitted infection, avoid sexual contact until you have finished your treatment. Your sexual partner may also need treatment. Avoid antacids, aluminum, calcium, magnesium, and iron products for 4 hours before and 2 hours after taking a dose of this medicine. If you are  using this medicine to prevent malaria, you should still protect yourself from contact with mosquitos. Stay in screened-in areas, use mosquito nets, keep your body covered, and use an insect repellent. What side effects may I notice from receiving this medicine? Side effects that you  should report to your doctor or health care professional as soon as possible: -allergic reactions like skin rash, itching or hives, swelling of the face, lips, or tongue -difficulty breathing -fever -itching in the rectal or genital area -pain on swallowing -redness, blistering, peeling or loosening of the skin, including inside the mouth -severe stomach pain or cramps -unusual bleeding or bruising -unusually weak or tired -yellowing of the eyes or skin Side effects that usually do not require medical attention (report to your doctor or health care professional if they continue or are bothersome): -diarrhea -loss of appetite -nausea, vomiting This list may not describe all possible side effects. Call your doctor for medical advice about side effects. You may report side effects to FDA at 1-800-FDA-1088. Where should I keep my medicine? Keep out of the reach of children. Store at room temperature, below 30 degrees C (86 degrees F). Protect from light. Keep container tightly closed. Throw away any unused medicine after the expiration date. Taking this medicine after the expiration date can make you seriously ill. NOTE: This sheet is a summary. It may not cover all possible information. If you have questions about this medicine, talk to your doctor, pharmacist, or health care provider.  2012, Elsevier/Gold Standard. (03/09/2008 4:53:02 PM)

## 2013-09-23 NOTE — Progress Notes (Signed)
GYNECOLOGY PROBLEM VISIT  PCP:   Referring provider:   HPI: 32 y.o.   Single  Caucasian  female   G2P0020 with Patient's last menstrual period was 06/02/2013.  Patient is on continuous Ortho Evra. here for pain onset yesterday and increased at 3:00 am today.  Taking Tramadol every 6 hours since yesterday and took one oxycodone in the middle of the night.  Last time she used narcotics was last week when she went to the [facility] with acute episode of pain and Ortho Evra patch had likely come loose. Had sweating last night when she was having pain.   Some diarrhea.  No blood in the stool. Not using Bentyl.   Some painful urination. States green vaginal discharge.   Has had normal pelvic ultrasound on 09/08/13. Prior MRI has documented uterine septum.  Had a colonoscopy in 2012 which was negative.   History of peptic ulcer diagnosed in 2012.   Status post appendectomy and status post cholecystectomy.  Patient states she is most worried about having cancer.   Scheduled for a hysteroscopic metroplasty and laparoscopy for pain   UPT - negative  Urine dip - trace protein, small ketones, trace blood.  Urine GC/CT done today.   GYNECOLOGIC HISTORY: Patient's last menstrual period was 06/02/2013. Sexually active: yes  Partner preference: female Contraception:   Ortho Evra.    OB History   Grav Para Term Preterm Abortions TAB SAB Ect Mult Living   2    2  2             Family History  Problem Relation Age of Onset  . Cancer Father     cholangiocarcinoma  . Breast cancer Paternal Grandmother   . Hypertension Paternal Grandmother   . Diabetes Paternal Grandmother   . Cancer Paternal Grandfather     cholangiocarcinoma/liver ca  . Cancer Maternal Grandfather     pancreatic cancer  . Ovarian cancer Maternal Aunt   . Hypertension Maternal Grandmother   . Diabetes Maternal Grandmother     Patient Active Problem List   Diagnosis Date Noted  . Dysmenorrhea 06/25/2013     Past Medical History  Diagnosis Date  . Anxiety   . Anemia   . Depression   . Dyspareunia   . Gastric ulcer   . Dysmenorrhea     Past Surgical History  Procedure Laterality Date  . Appendectomy    . Cholecystectomy      ALLERGIES: Augmentin and Erythromycin  Current Outpatient Prescriptions  Medication Sig Dispense Refill  . ALOE VERA JUICE PO Take by mouth daily.      Marland Kitchen ketorolac (TORADOL) 10 MG tablet Take 1 tablet (10 mg total) by mouth every 6 (six) hours as needed for pain.  20 tablet  0  . norelgestromin-ethinyl estradiol (ORTHO EVRA) 150-35 MCG/24HR transdermal patch Place 1 patch onto the skin once a week.  12 patch  1  . oxyCODONE-acetaminophen (PERCOCET/ROXICET) 5-325 MG per tablet Take 1 tablet by mouth every 4 (four) hours as needed for pain.  20 tablet  0  . sertraline (ZOLOFT) 25 MG tablet Take 50 mg by mouth at bedtime. Take 2 tabs daily      . traMADol (ULTRAM) 50 MG tablet Take 1 tablet (50 mg total) by mouth every 6 (six) hours as needed for pain.  30 tablet  0  . traZODone (DESYREL) 100 MG tablet Take 50-200 mg by mouth at bedtime. 1/2 to 2 tablets at bedtime      .  dicyclomine (BENTYL) 10 MG capsule Take 1-2 capsules (10-20 mg total) by mouth 4 (four) times daily -  before meals and at bedtime.  30 capsule  1  . nystatin-triamcinolone (MYCOLOG II) cream Apply topically 2 (two) times daily. Apply to affected area BID for up to 7 days.  60 g  0   No current facility-administered medications for this visit.     ROS:  Pertinent items are noted in HPI.  SOCIAL HISTORY:  Engaged to be married.  Has a "step son" who lives with her and her fiance.  Little boy's mother sees him on a limited basis.  PHYSICAL EXAMINATION:    BP 110/68  Pulse 81  Temp(Src) 98.1 F (36.7 C)  Resp 16  Wt 105 lb (47.628 kg)  BMI 21.2 kg/m2  LMP 06/02/2013   Wt Readings from Last 3 Encounters:  09/23/13 105 lb (47.628 kg)  09/16/13 104 lb (47.174 kg)  09/08/13 104 lb  (47.174 kg)     Ht Readings from Last 3 Encounters:  09/08/13 4\' 11"  (1.499 m)  08/26/13 4\' 11"  (1.499 m)  07/27/13 4\' 11"  (1.499 m)    General appearance:  Tearful but becomes calm during visit and examination.    Abdomen:  Active bowel sounds, some guarding of bilateral lower abdomen, no rebound.                      No hepatosplenomegaly or organomegaly.  Pelvic: External genitalia:  no lesions. Ortho Evra is just able left mons pubis.               Urethra:  normal appearing urethra with no masses, tenderness or lesions              Bartholins and Skenes: normal                 Vagina: normal appearing vagina with normal color and discharge, no lesions              Cervix: normal appearance.  No discharge noted. Cervical motion tenderness noted.                Bimanual Exam:  Uterus:  uterus is normal size, shape, consistency and tender.                                      Adnexa: normal adnexa in size, bilateral tenderness and no masses                                      Rectovaginal: Confirms                                      Anus:  normal sphincter tone, no lesions  ASSESSMENT  Chronic pelvic pain.  Possible PID. Known uterine septum. Positive urinalysis. Amenorrhea on continuous Ortho Evra.  Negative UPT.  PLAN  Urine culture. GC/CT. CBC with diff. Rocephin 250 mg IM now. Doxycycline 100 mg po bid for 14 days. OK to take Ultram and oxycodone prn.  Patient will return in 48 hours for reassessment. Note for no work until returns for re-evaluation.    An After Visit Summary was printed and given to the patient.

## 2013-09-23 NOTE — Telephone Encounter (Signed)
Spoke with patient.  Denies fevers.  8/10 lower abdominal pain. Started yesterday and could not go to work. Woke up at 0300 and took percocet and toradol pills. Rates pain now as 8/10. Also having thick green discharge "on and off" for 3 days.  Spoke with Dr. Edward Jolly, patient can come now to office.  Spoke with patient. She will shower and come to office, for work in, patient agreeable to plan. States that fiance will drive her.   Patient has appointment for disposition, routing to provider for review. Will close encounter.

## 2013-09-24 LAB — URINALYSIS, MICROSCOPIC ONLY: Casts: NONE SEEN

## 2013-09-24 LAB — GC/CHLAMYDIA PROBE AMP
CT Probe RNA: NEGATIVE
GC Probe RNA: NEGATIVE

## 2013-09-24 LAB — URINE CULTURE
Colony Count: NO GROWTH
Organism ID, Bacteria: NO GROWTH

## 2013-09-25 ENCOUNTER — Ambulatory Visit (INDEPENDENT_AMBULATORY_CARE_PROVIDER_SITE_OTHER): Payer: BC Managed Care – PPO | Admitting: Obstetrics and Gynecology

## 2013-09-25 ENCOUNTER — Encounter: Payer: Self-pay | Admitting: Obstetrics and Gynecology

## 2013-09-25 VITALS — BP 100/56 | HR 88 | Temp 98.2°F | Ht 59.0 in | Wt 104.5 lb

## 2013-09-25 DIAGNOSIS — M25559 Pain in unspecified hip: Secondary | ICD-10-CM

## 2013-09-25 NOTE — Patient Instructions (Signed)
Please take your bowel prep, magnesium citrate the day prior to surgery.  Start by 12:00 noon at the latest. Do clear liquids the day before surgery.  Call if you need anything before surgery.

## 2013-09-25 NOTE — Progress Notes (Signed)
Patient ID: Sheri Cameron, female   DOB: 12/23/1980, 32 y.o.   MRN: [id number]  Subjective  Patient is here for a recheck.  Was seen 2 days ago in the office for pelvic pain and treated presumptively for PID.  Received Rocephin and started on a 14 day course of doxycycline.  Feeling some better but weak overall.  Diarrhea resolved.  Still some dysuria. Vaginal discharge - green - no odor.   Eating well.  Using adhesive to help with adherence of Ortho Evra to skin.  Cannot get a new patch for a few days due to insurance limitations.   States she has to go pick up her wedding dress but that she has no energy to do so.  States she is looking forward to the wedding back in [place].  All is well with moving forward with the wedding.   Objective  Patch adherent to skin above left mons with bandaids.  Pelvic - normal external genitalia and urethra.  Cervix and vagina.  No lesions.  No discharge noted. No CMT.  Uterus nontender.  No adnexal masses or tenderness.  WBC 11.8 with no left shift. Urine culture negative. GC/CT - negative.  Assessment  Chronic pelvic pain.   Being treated for presumed PID, although cultures are negative for GC/CT. Diarrhea resolved. Normal colonoscopy 2 years ago.  No UTI. Known Mullerian anomaly with thin uterine septum.  NO noncommunicating horn of uterus.   Plan  Finish antibiotics. Proceed with surgery on 11/4 for metroplasty and laparoscopy. Patient will do a bowel prep and clear liquids the day prior to surgery. Follow up prn prior to surgery.

## 2013-09-26 ENCOUNTER — Encounter: Payer: Self-pay | Admitting: Obstetrics and Gynecology

## 2013-09-28 ENCOUNTER — Encounter (HOSPITAL_COMMUNITY): Payer: Self-pay

## 2013-09-28 ENCOUNTER — Encounter (HOSPITAL_COMMUNITY)
Admission: RE | Admit: 2013-09-28 | Discharge: 2013-09-28 | Disposition: A | Discharge status: Home / Self Care | Payer: BC Managed Care – PPO | Source: Ambulatory Visit | Attending: Obstetrics and Gynecology | Admitting: Obstetrics and Gynecology

## 2013-09-28 DIAGNOSIS — Z01818 Encounter for other preprocedural examination: Secondary | ICD-10-CM | POA: Insufficient documentation

## 2013-09-28 DIAGNOSIS — Z01812 Encounter for preprocedural laboratory examination: Secondary | ICD-10-CM | POA: Insufficient documentation

## 2013-09-28 HISTORY — DX: Unspecified asthma, uncomplicated: J45.909

## 2013-09-28 HISTORY — DX: Headache: R51

## 2013-09-28 NOTE — Patient Instructions (Signed)
Your procedure is scheduled on:10/06/13  Enter through the Main Entrance at : 6am Pick up desk phone and dial [phone] and inform us of your arrival.  Please call [phone] if you have any problems the morning of surgery.  Remember: Do not eat food or drink liquids, including water, after midnight:Monday   You may brush your teeth the morning of surgery.   DO NOT wear jewelry, eye make-up, lipstick,body lotion, or dark fingernail polish.  (Polished toes are ok) You may wear deodorant.   Patients discharged on the day of surgery will not be allowed to drive home. Wear loose fitting, comfortable clothes for your ride home.   

## 2013-10-05 NOTE — H&P (Signed)
Patient ID: Sheri Cameron, female   DOB: December 27, 1980, 32 y.o.   MRN: [id number]  GYNECOLOGY PROBLEM VISIT  HPI: 32 y.o.   Single  Caucasian  female    G2P0020 with Patient's last menstrual period was 06/02/2013.    here on 09/16/13 for surgical discussion.  Patients mother is present for discussion and patient's examination.   Patient has a history of incapacitating dysmenorrhea and SAB.  Office ultrasound noted a septate uterus.  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   Patient has had a trial of OCPs, Nuva Ring, Ortho Evra, narcotics, and Tramadol to control her pain.  Ortho Evra working best in continuous fashion, but recently patient had patch fall off which resulted in tremendous pain and spotting.  She went to the Emergency Department at [facility] and had a normal pelvic ultrasound.  Patient has had previously negative GC/CT testing.   In 2012 had EGD/Colonoscopy due to abdominal pain. Found to have E.Coli and gastric ulcer.Lissa Hoard, [place]) - patient had this infection after was in [place].   Patient ready to proceed with hysteroscopic resection of septum and laparoscopy with possible treatment of endometriosis.  Has been waiting for insurance benefits in order to proceed with surgery.   GYNECOLOGIC HISTORY: Patient's last menstrual period was 06/02/2013. Sexually active:  Yes. Partner preference:   Female. Contraception:   Ortho Evra - continuous. 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 Had Colposcopy and LEEP which revealed LSIL. Mammogram:  NA           Pap: 06/04/13 - normal.        OB History     Grav  Para  Term  Preterm  Abortions  TAB  SAB  Ect  Mult  Living     2        2    2                    Family History   Problem  Relation  Age of Onset   .  Cancer  Father          cholangiocarcinoma   .  Breast cancer  Paternal Grandmother     .  Hypertension  Paternal Grandmother     .  Diabetes  Paternal Grandmother     .  Cancer  Paternal Grandfather         cholangiocarcinoma/liver ca   .  Cancer  Maternal Grandfather         pancreatic cancer   .  Ovarian cancer  Maternal Aunt     .  Hypertension  Maternal Grandmother     .  Diabetes  Maternal Grandmother         Patient Active Problem List     Diagnosis  Date Noted   .  Dysmenorrhea  06/25/2013       Past Medical History   Diagnosis  Date   .  Anxiety     .  Anemia     .  Depression     .  Dyspareunia     .  Gastric ulcer     .  Dysmenorrhea         Past Surgical History   Procedure  Laterality  Date   .  Appendectomy       .  Cholecystectomy         ALLERGIES: Augmentin and Erythromycin    Current Outpatient Prescriptions   Medication  Sig  Dispense  Refill   .  dicyclomine (BENTYL) 10 MG capsule  Take 1-2 capsules (10-20 mg total) by mouth 4 (four) times daily -  before meals and at bedtime.   30 capsule   1   .  ketorolac (TORADOL) 10 MG tablet  Take 1 tablet (10 mg total) by mouth every 6 (six) hours as needed for pain.   20 tablet   0   .  norelgestromin-ethinyl estradiol (ORTHO EVRA) 150-35 MCG/24HR transdermal patch  Place 1 patch onto the skin once a week.   12 patch   1   .  oxyCODONE-acetaminophen (PERCOCET/ROXICET) 5-325 MG per tablet  Take 1 tablet by mouth every 4 (four) hours as needed for pain.   20 tablet   0   .  sertraline (ZOLOFT) 25 MG tablet  Take 50 mg by mouth at bedtime. Take 2 tabs daily         .  traMADol (ULTRAM) 50 MG tablet  Take 1 tablet (50 mg total) by mouth every 6 (six) hours as needed for pain.   30 tablet   0   .  traZODone (DESYREL) 100 MG tablet  Take 50 mg by mouth at bedtime. 1/2 to 2 tablets at bedtime         .  fluconazole (DIFLUCAN) 150 MG tablet  Take 1 tablet (150 mg total) by mouth once. Take one tablet.  Repeat in 48 hours if symptoms are not  completely resolved.   2 tablet   0   .  nystatin-triamcinolone (MYCOLOG II) cream  Apply topically 2 (two) times daily. Apply to affected area BID for up to 7 days.   60 g   0      No current facility-administered medications for this visit.      ROS:  Pertinent items are noted in HPI.  SOCIAL HISTORY:  Engaged.    PHYSICAL EXAMINATION:    BP 102/70  Pulse 80  Resp 20  Wt 104 lb (47.174 kg)  BMI 20.99 kg/m2  LMP 06/02/2013    Wt Readings from Last 3 Encounters:   09/16/13  104 lb (47.174 kg)   09/08/13  104 lb (47.174 kg)   08/26/13  104 lb (47.174 kg)       Ht Readings from Last 3 Encounters:   09/08/13  4\' 11"  (1.499 m)   08/26/13  4\' 11"  (1.499 m)   07/27/13  4\' 11"  (1.499 m)     General appearance: alert, cooperative and appears stated age Head: Normocephalic, without obvious abnormality, atraumatic Neck: no adenopathy, supple, symmetrical, trachea midline and thyroid not enlarged, symmetric, no tenderness/mass/nodules Lungs: clear to auscultation bilaterally Breasts: Inspection negative, No nipple retraction or dimpling, No nipple discharge or bleeding, No axillary or supraclavicular adenopathy, Normal to palpation without dominant masses Heart: regular rate and rhythm Abdomen: soft,  Mildly tender in left lower quadrant; no masses,  no organomegaly Extremities: extremities normal, atraumatic, no cyanosis or edema Skin: Skin color, texture, turgor normal. No rashes or lesions Lymph nodes: Cervical, supraclavicular, and axillary nodes normal. No abnormal inguinal nodes palpated Neurologic: Grossly normal  Pelvic: External genitalia:  no lesions              Urethra:  normal appearing urethra with no masses, tenderness or lesions  Bartholins and Skenes: normal                  Vagina: normal appearing vagina with normal color and discharge, no lesions              Cervix: normal appearance                   Bimanual Exam:  Uterus:  uterus is normal  size, shape, consistency and mildly tender                                      Adnexa: normal adnexa in size, nontender and no masses                                      Rectovaginal: Confirms                                      Anus:  normal sphincter tone, no lesions  ASSESSMENT  Dysmenorrhea. Thin uterine septum. History of LEEP. History of SAB. History of gastric ulcer.    PLAN  Proceed with metroplasty and laparoscopy with possible treatment of endometriosis and adhesive disease.  Benefits and risks of the procedure discussed. We discussed surgical repair with a hysteroscopic resection of the septal wall with laparoscopic guidance. This will also allow an opportunity for diagnosis and treatment of endometriosis or pelvic adhesions. We discussed that the laparoscopy may not reveal the source of her pain and that the septoplasty would not resolve her pain but would simply unify the cavity and reduce risk of future miscarriage and preterm labor.   Other discussed risks of the procedure include but are not limited to bleeding, infection, trauma to surrounding organs, DVT, PE, reaction to   Anesthesia, death, hyponatremia, pulmonary edema, uterine perforation, heavy uterine bleeding requiring Foley balloon treatment and hospital observation, development of adhesive disease intraperitoneally or in the uterine cavity, and incompleteness of the procedure.   The patient and her mother indicate understanding, and the patient wishes to proceed.   I instructed the patient to continue with the Ortho Evra continuously and not stop this prior to surgery.  I believe the risks of removal outweigh the benefit, and I do not want the patient to have a menstrual cycle when I will need to be proceeding with resection of the uterine septum.  Patient will take magnesium citrate starting at noon the day prior to her surgery and do clear liquids the day prior to surgery.  NPO after midnight.   ADDENDUM    Patient presented on 09/23/13 with an exacerbation of pelvic pain and had cervical motion tenderness, uterine tenderness, adnexal tenderness and was treated for presumed pelvic inflammatory disease with Rocephin and a 14 day course of doxycycline.  WBC was 11.8 and GC/CT were negative.  UC was negative.  Was improved on 09/25/13.  Plan made to proceed with surgery on 10/06/13 which needed to be delayed until this date due to insurance reasons for the patient.

## 2013-10-06 ENCOUNTER — Encounter (HOSPITAL_COMMUNITY): Payer: BC Managed Care – PPO | Admitting: Anesthesiology

## 2013-10-06 ENCOUNTER — Ambulatory Visit (HOSPITAL_COMMUNITY)
Admission: RE | Admit: 2013-10-06 | Discharge: 2013-10-06 | Disposition: A | Discharge status: Home / Self Care | Payer: BC Managed Care – PPO | Source: Ambulatory Visit | Attending: Obstetrics and Gynecology | Admitting: Obstetrics and Gynecology

## 2013-10-06 ENCOUNTER — Ambulatory Visit (HOSPITAL_COMMUNITY): Payer: BC Managed Care – PPO | Admitting: Anesthesiology

## 2013-10-06 ENCOUNTER — Encounter (HOSPITAL_COMMUNITY)
Admission: RE | Disposition: A | Discharge status: Home / Self Care | Payer: Self-pay | Source: Ambulatory Visit | Attending: Obstetrics and Gynecology

## 2013-10-06 DIAGNOSIS — N841 Polyp of cervix uteri: Secondary | ICD-10-CM | POA: Insufficient documentation

## 2013-10-06 DIAGNOSIS — G8929 Other chronic pain: Secondary | ICD-10-CM | POA: Insufficient documentation

## 2013-10-06 DIAGNOSIS — Z8711 Personal history of peptic ulcer disease: Secondary | ICD-10-CM | POA: Insufficient documentation

## 2013-10-06 DIAGNOSIS — Q5128 Other doubling of uterus, other specified: Secondary | ICD-10-CM | POA: Insufficient documentation

## 2013-10-06 DIAGNOSIS — N803 Endometriosis of pelvic peritoneum, unspecified: Secondary | ICD-10-CM | POA: Insufficient documentation

## 2013-10-06 DIAGNOSIS — N9489 Other specified conditions associated with female genital organs and menstrual cycle: Secondary | ICD-10-CM

## 2013-10-06 DIAGNOSIS — N946 Dysmenorrhea, unspecified: Secondary | ICD-10-CM

## 2013-10-06 DIAGNOSIS — Q512 Other doubling of uterus, unspecified: Secondary | ICD-10-CM

## 2013-10-06 DIAGNOSIS — N949 Unspecified condition associated with female genital organs and menstrual cycle: Secondary | ICD-10-CM | POA: Insufficient documentation

## 2013-10-06 HISTORY — PX: CERVICAL POLYPECTOMY: SHX88

## 2013-10-06 HISTORY — PX: LAPAROSCOPY: SHX197

## 2013-10-06 HISTORY — PX: DILATATION & CURRETTAGE/HYSTEROSCOPY WITH RESECTOCOPE: SHX5572

## 2013-10-06 LAB — CBC
HCT: 36.9 % (ref 36.0–46.0)
Hemoglobin: 12.2 g/dL (ref 12.0–15.0)
Hemoglobin: 11.7 g/dL — ABNORMAL LOW (ref 12.0–15.0)
MCHC: 33.1 g/dL (ref 30.0–36.0)
MCHC: 33.1 g/dL (ref 30.0–36.0)
RBC: 4.38 MIL/uL (ref 3.87–5.11)
RBC: 4.16 MIL/uL (ref 3.87–5.11)
WBC: 9 10*3/uL (ref 4.0–10.5)
WBC: 13.3 10*3/uL — ABNORMAL HIGH (ref 4.0–10.5)

## 2013-10-06 LAB — PREGNANCY, URINE: Preg Test, Ur: NEGATIVE

## 2013-10-06 SURGERY — DILATATION & CURETTAGE/HYSTEROSCOPY WITH RESECTOCOPE
Anesthesia: General | Site: Vagina | Wound class: Clean Contaminated

## 2013-10-06 MED ORDER — LIDOCAINE HCL 1 % IJ SOLN
INTRAMUSCULAR | Status: AC
  Filled 2013-10-06: qty 20

## 2013-10-06 MED ORDER — PROPOFOL 10 MG/ML IV BOLUS
INTRAVENOUS | Status: DC | PRN
  Administered 2013-10-06: 150 mg via INTRAVENOUS

## 2013-10-06 MED ORDER — FENTANYL CITRATE 0.05 MG/ML IJ SOLN
INTRAMUSCULAR | Status: DC | PRN
  Administered 2013-10-06 (×4): 50 ug via INTRAVENOUS

## 2013-10-06 MED ORDER — NEOSTIGMINE METHYLSULFATE 1 MG/ML IJ SOLN
INTRAMUSCULAR | Status: DC | PRN
  Administered 2013-10-06: 3 mg via INTRAVENOUS

## 2013-10-06 MED ORDER — MIDAZOLAM HCL 2 MG/2ML IJ SOLN
INTRAMUSCULAR | Status: DC | PRN
  Administered 2013-10-06: 2 mg via INTRAVENOUS

## 2013-10-06 MED ORDER — OXYCODONE-ACETAMINOPHEN 5-325 MG PO TABS
ORAL_TABLET | ORAL | Status: AC
  Filled 2013-10-06: qty 1

## 2013-10-06 MED ORDER — MIDAZOLAM HCL 2 MG/2ML IJ SOLN
INTRAMUSCULAR | Status: AC
  Filled 2013-10-06: qty 2

## 2013-10-06 MED ORDER — GLYCOPYRROLATE 0.2 MG/ML IJ SOLN
INTRAMUSCULAR | Status: AC
  Filled 2013-10-06: qty 2

## 2013-10-06 MED ORDER — FENTANYL CITRATE 0.05 MG/ML IJ SOLN
INTRAMUSCULAR | Status: AC
  Administered 2013-10-06: 50 ug via INTRAVENOUS
  Filled 2013-10-06: qty 2

## 2013-10-06 MED ORDER — NEOSTIGMINE METHYLSULFATE 1 MG/ML IJ SOLN
INTRAMUSCULAR | Status: AC
  Filled 2013-10-06: qty 1

## 2013-10-06 MED ORDER — ROCURONIUM BROMIDE 100 MG/10ML IV SOLN
INTRAVENOUS | Status: DC | PRN
  Administered 2013-10-06: 5 mg via INTRAVENOUS
  Administered 2013-10-06: 30 mg via INTRAVENOUS

## 2013-10-06 MED ORDER — FENTANYL CITRATE 0.05 MG/ML IJ SOLN
25.0000 ug | INTRAMUSCULAR | Status: DC | PRN
  Administered 2013-10-06 (×3): 50 ug via INTRAVENOUS

## 2013-10-06 MED ORDER — PHENYLEPHRINE 40 MCG/ML (10ML) SYRINGE FOR IV PUSH (FOR BLOOD PRESSURE SUPPORT)
PREFILLED_SYRINGE | INTRAVENOUS | Status: AC
  Filled 2013-10-06: qty 5

## 2013-10-06 MED ORDER — KETOROLAC TROMETHAMINE 30 MG/ML IJ SOLN: 15.0000 mg | Freq: Once | INTRAMUSCULAR | Status: DC | PRN

## 2013-10-06 MED ORDER — LIDOCAINE HCL (CARDIAC) 20 MG/ML IV SOLN
INTRAVENOUS | Status: AC
  Filled 2013-10-06: qty 5

## 2013-10-06 MED ORDER — BUPIVACAINE HCL (PF) 0.25 % IJ SOLN
INTRAMUSCULAR | Status: AC
  Filled 2013-10-06: qty 30

## 2013-10-06 MED ORDER — ACETAMINOPHEN 160 MG/5ML PO SOLN
ORAL | Status: AC
  Administered 2013-10-06: 975 mg via ORAL
  Filled 2013-10-06: qty 40.6

## 2013-10-06 MED ORDER — ONDANSETRON HCL 4 MG/2ML IJ SOLN
INTRAMUSCULAR | Status: DC | PRN
  Administered 2013-10-06: 4 mg via INTRAVENOUS

## 2013-10-06 MED ORDER — MEPERIDINE HCL 25 MG/ML IJ SOLN: 6.2500 mg | INTRAMUSCULAR | Status: DC | PRN

## 2013-10-06 MED ORDER — DEXAMETHASONE SODIUM PHOSPHATE 10 MG/ML IJ SOLN
INTRAMUSCULAR | Status: DC | PRN
  Administered 2013-10-06: 10 mg via INTRAVENOUS

## 2013-10-06 MED ORDER — PROPOFOL 10 MG/ML IV EMUL
INTRAVENOUS | Status: AC
  Filled 2013-10-06: qty 20

## 2013-10-06 MED ORDER — LIDOCAINE HCL (CARDIAC) 20 MG/ML IV SOLN
INTRAVENOUS | Status: DC | PRN
  Administered 2013-10-06: 50 mg via INTRAVENOUS

## 2013-10-06 MED ORDER — ACETAMINOPHEN 160 MG/5ML PO SOLN
975.0000 mg | Freq: Once | ORAL | Status: AC
  Administered 2013-10-06: 975 mg via ORAL

## 2013-10-06 MED ORDER — LACTATED RINGERS IV SOLN
INTRAVENOUS | Status: DC
  Administered 2013-10-06 (×3): via INTRAVENOUS

## 2013-10-06 MED ORDER — FENTANYL CITRATE 0.05 MG/ML IJ SOLN
INTRAMUSCULAR | Status: AC
  Filled 2013-10-06: qty 5

## 2013-10-06 MED ORDER — OXYCODONE-ACETAMINOPHEN 5-325 MG PO TABS
1.0000 | ORAL_TABLET | Freq: Once | ORAL | Status: AC
  Administered 2013-10-06: 1 via ORAL

## 2013-10-06 MED ORDER — SODIUM CHLORIDE 0.9 % IR SOLN
Status: DC | PRN
  Administered 2013-10-06: 3000 mL

## 2013-10-06 MED ORDER — METOCLOPRAMIDE HCL 5 MG/ML IJ SOLN: 10.0000 mg | Freq: Once | INTRAMUSCULAR | Status: DC | PRN

## 2013-10-06 MED ORDER — PHENYLEPHRINE HCL 10 MG/ML IJ SOLN
INTRAMUSCULAR | Status: DC | PRN
  Administered 2013-10-06: 40 ug via INTRAVENOUS
  Administered 2013-10-06: 80 ug via INTRAVENOUS
  Administered 2013-10-06: 40 ug via INTRAVENOUS

## 2013-10-06 MED ORDER — OXYCODONE-ACETAMINOPHEN 5-325 MG PO TABS: 1.0000 | ORAL_TABLET | ORAL | Status: DC | PRN

## 2013-10-06 MED ORDER — FENTANYL CITRATE 0.05 MG/ML IJ SOLN
INTRAMUSCULAR | Status: AC
  Filled 2013-10-06: qty 2

## 2013-10-06 MED ORDER — ROCURONIUM BROMIDE 100 MG/10ML IV SOLN
INTRAVENOUS | Status: AC
  Filled 2013-10-06: qty 1

## 2013-10-06 MED ORDER — GLYCOPYRROLATE 0.2 MG/ML IJ SOLN
INTRAMUSCULAR | Status: DC | PRN
  Administered 2013-10-06: 0.1 mg via INTRAVENOUS
  Administered 2013-10-06: 0.4 mg via INTRAVENOUS

## 2013-10-06 MED ORDER — ONDANSETRON HCL 4 MG/2ML IJ SOLN
INTRAMUSCULAR | Status: AC
  Filled 2013-10-06: qty 2

## 2013-10-06 MED ORDER — DEXAMETHASONE SODIUM PHOSPHATE 10 MG/ML IJ SOLN
INTRAMUSCULAR | Status: AC
  Filled 2013-10-06: qty 1

## 2013-10-06 MED ORDER — BUPIVACAINE HCL (PF) 0.25 % IJ SOLN
INTRAMUSCULAR | Status: DC | PRN
  Administered 2013-10-06: 5 mL

## 2013-10-06 MED ORDER — LACTATED RINGERS IV SOLN
INTRAVENOUS | Status: DC
  Administered 2013-10-06: 11:00:00 via INTRAVENOUS

## 2013-10-06 SURGICAL SUPPLY — 38 items
BENZOIN TINCTURE PRP APPL 2/3 (GAUZE/BANDAGES/DRESSINGS) IMPLANT
CABLE HIGH FREQUENCY MONO STRZ (ELECTRODE) IMPLANT
CANISTER SUCT 3000ML (MISCELLANEOUS) ×3 IMPLANT
CATH ROBINSON RED A/P 16FR (CATHETERS) IMPLANT
CLOTH BEACON ORANGE TIMEOUT ST (SAFETY) ×3 IMPLANT
CONTAINER PREFILL 10% NBF 60ML (FORM) ×3 IMPLANT
DERMABOND ADVANCED (GAUZE/BANDAGES/DRESSINGS)
DERMABOND ADVANCED .7 DNX12 (GAUZE/BANDAGES/DRESSINGS) IMPLANT
DRESSING TELFA 8X3 (GAUZE/BANDAGES/DRESSINGS) ×3 IMPLANT
ELECT REM PT RETURN 9FT ADLT (ELECTROSURGICAL) ×3
ELECTRODE REM PT RTRN 9FT ADLT (ELECTROSURGICAL) ×2 IMPLANT
ELECTRODE TWIZZLE TIP (MISCELLANEOUS) ×3 IMPLANT
GLOVE BIO SURGEON STRL SZ 6.5 (GLOVE) ×6 IMPLANT
GLOVE BIOGEL PI IND STRL 7.0 (GLOVE) ×4 IMPLANT
GLOVE BIOGEL PI INDICATOR 7.0 (GLOVE) ×2
GOWN PREVENTION PLUS LG XLONG (DISPOSABLE) ×6 IMPLANT
GOWN STRL REIN XL XLG (GOWN DISPOSABLE) ×9 IMPLANT
LOOP ANGLED CUTTING 22FR (CUTTING LOOP) IMPLANT
NEEDLE SPNL 20GX3.5 QUINCKE YW (NEEDLE) IMPLANT
NS IRRIG 1000ML POUR BTL (IV SOLUTION) IMPLANT
PACK HYSTEROSCOPY LF (CUSTOM PROCEDURE TRAY) ×3 IMPLANT
PACK LAPAROSCOPY BASIN (CUSTOM PROCEDURE TRAY) ×3 IMPLANT
PAD OB MATERNITY 4.3X12.25 (PERSONAL CARE ITEMS) ×3 IMPLANT
PROTECTOR NERVE ULNAR (MISCELLANEOUS) ×9 IMPLANT
SCISSORS LAP 5X35 DISP (ENDOMECHANICALS) IMPLANT
SEALER TISSUE G2 CVD JAW 35 (ENDOMECHANICALS) IMPLANT
SEALER TISSUE G2 CVD JAW 45CM (ENDOMECHANICALS)
SET IRRIG TUBING LAPAROSCOPIC (IRRIGATION / IRRIGATOR) IMPLANT
STRIP CLOSURE SKIN 1/4X3 (GAUZE/BANDAGES/DRESSINGS) IMPLANT
SUT PLAIN 3 0 FS 2 27 (SUTURE) ×3 IMPLANT
SUT VICRYL 0 UR6 27IN ABS (SUTURE) IMPLANT
TOWEL OR 17X24 6PK STRL BLUE (TOWEL DISPOSABLE) ×6 IMPLANT
TRAY FOLEY CATH 14FR (SET/KITS/TRAYS/PACK) ×3 IMPLANT
TROCAR XCEL DIL TIP R 11M (ENDOMECHANICALS) IMPLANT
TROCAR XCEL NON-BLD 5MMX100MML (ENDOMECHANICALS) IMPLANT
TROCAR XCEL OPT SLVE 5M 100M (ENDOMECHANICALS) IMPLANT
WARMER LAPAROSCOPE (MISCELLANEOUS) ×3 IMPLANT
WATER STERILE IRR 1000ML POUR (IV SOLUTION) ×3 IMPLANT

## 2013-10-06 NOTE — Anesthesia Preprocedure Evaluation (Addendum)
Anesthesia Evaluation  Patient identified by MRN, date of birth, ID band Patient awake    Reviewed: Allergy & Precautions, H&P , NPO status , Patient's Chart, lab work & pertinent test results, reviewed documented beta blocker date and time   History of Anesthesia Complications Negative for: history of anesthetic complications  Airway Mallampati: II TM Distance: >3 FB Neck ROM: full    Dental  (+) Teeth Intact   Pulmonary asthma (last inhaler use over a year ago, only needs with bronchitis) ,  breath sounds clear to auscultation  Pulmonary exam normal       Cardiovascular Exercise Tolerance: Good negative cardio ROS  Rhythm:regular Rate:Normal     Neuro/Psych  Headaches, PSYCHIATRIC DISORDERS (anxiety/depression) negative neurological ROS     GI/Hepatic negative GI ROS, Neg liver ROS, PUD (h/o, no current problems),   Endo/Other  negative endocrine ROS  Renal/GU negative Renal ROS  Female GU complaint     Musculoskeletal   Abdominal   Peds  Hematology negative hematology ROS (+)   Anesthesia Other Findings   Reproductive/Obstetrics negative OB ROS                          Anesthesia Physical Anesthesia Plan  ASA: II  Anesthesia Plan: General ETT   Post-op Pain Management:    Induction:   Airway Management Planned:   Additional Equipment:   Intra-op Plan:   Post-operative Plan:   Informed Consent: I have reviewed the patients History and Physical, chart, labs and discussed the procedure including the risks, benefits and alternatives for the proposed anesthesia with the patient or authorized representative who has indicated his/her understanding and acceptance.   Dental Advisory Given  Plan Discussed with: CRNA and Surgeon  Anesthesia Plan Comments:        Anesthesia Quick Evaluation

## 2013-10-06 NOTE — Brief Op Note (Signed)
10/06/2013  9:12 AM  PATIENT:  Sheri Cameron  32 y.o. female  PRE-OPERATIVE DIAGNOSIS:  Uterine septum, dysmenorrhea, chronic pelvic pain  POST-OPERATIVE DIAGNOSIS:  Uterine septum, dysmenorrhea, chronic pelvic pain, mild endometriosis, cervical polyp  PROCEDURE:  Procedure(s) with comments: HYSTEROSCOPY WITH RESECTION OF UTERINE SEPTUM (N/A) - resection of uterine septum.  LAPAROSCOPY DIAGNOSTIC, FULGERATION OF ENDOMETRIOSIS (N/A) CERVICAL POLYPECTOMY (N/A)  SURGEON:  Surgeon(s) and Role:    * Melony Overly, MD - Primary    * Bennye Alm, MD - Assisting  PHYSICIAN ASSISTANT:   ASSISTANTS:  Douglass Rivers, MD   ANESTHESIA:   local and general  EBL:  Total I/O In: 2500 [I.V.:2500] Out: 150 [Urine:100; Blood:50]  BLOOD ADMINISTERED:none  DRAINS: none   LOCAL MEDICATIONS USED:  MARCAINE     SPECIMEN:  Source of Specimen:   endocervical polyp  DISPOSITION OF SPECIMEN:  PATHOLOGY  COUNTS:  YES  TOURNIQUET:  * No tourniquets in log *  DICTATION: .Other Dictation: Dictation Number    PLAN OF CARE: Discharge to home after PACU  PATIENT DISPOSITION:  PACU - hemodynamically stable.   Delay start of Pharmacological VTE agent (>24hrs) due to surgical blood loss or risk of bleeding: not applicable

## 2013-10-06 NOTE — Anesthesia Postprocedure Evaluation (Signed)
Anesthesia Post Note  Patient: Sheri Cameron  Procedure(s) Performed: Procedure(s) (LRB): HYSTEROSCOPY WITH RESECTION OF UTERINE SEPTUM (N/A) LAPAROSCOPY DIAGNOSTIC, FULGERATION OF ENDOMETRIOSIS (N/A) CERVICAL POLYPECTOMY (N/A)  Anesthesia type: General  Patient location: PACU  Post pain: Pain level controlled  Post assessment: Post-op Vital signs reviewed  Last Vitals:  Filed Vitals:   10/06/13 1158  BP:   Pulse: 89  Temp: 36.7 C  Resp: 20    Post vital signs: Reviewed  Level of consciousness: sedated  Complications: No apparent anesthesia complications

## 2013-10-06 NOTE — Anesthesia Procedure Notes (Signed)
Procedure Name: Intubation Date/Time: 10/06/2013 7:29 AM Performed by: Graciela Husbands Pre-anesthesia Checklist: Suction available, Emergency Drugs available, Timeout performed, Patient being monitored and Patient identified Patient Re-evaluated:Patient Re-evaluated prior to inductionOxygen Delivery Method: Circle system utilized Preoxygenation: Pre-oxygenation with 100% oxygen Intubation Type: IV induction Ventilation: Mask ventilation without difficulty Laryngoscope Size: Mac and 3 Grade View: Grade I Tube type: Oral Tube size: 7.0 mm Number of attempts: 1 Airway Equipment and Method: Stylet Placement Confirmation: ETT inserted through vocal cords under direct vision,  positive ETCO2 and breath sounds checked- equal and bilateral Secured at: 20 cm Tube secured with: Tape Dental Injury: Teeth and Oropharynx as per pre-operative assessment

## 2013-10-06 NOTE — Transfer of Care (Signed)
Immediate Anesthesia Transfer of Care Note  Patient: Sheri Cameron  Procedure(s) Performed: Procedure(s) with comments: HYSTEROSCOPY WITH RESECTION OF UTERINE SEPTUM (N/A) - resection of uterine septum.  LAPAROSCOPY DIAGNOSTIC, FULGERATION OF ENDOMETRIOSIS (N/A) CERVICAL POLYPECTOMY (N/A)  Patient Location: PACU  Anesthesia Type:General  Level of Consciousness: awake, alert  and oriented  Airway & Oxygen Therapy: Patient Spontanous Breathing and Patient connected to nasal cannula oxygen  Post-op Assessment: Report given to PACU RN and Post -op Vital signs reviewed and stable  Post vital signs: Reviewed and stable  Complications: No apparent anesthesia complications

## 2013-10-06 NOTE — Progress Notes (Signed)
Update History and Physical  No marked change in status.  Patient examined. OK to proceed with surgery.

## 2013-10-07 ENCOUNTER — Encounter (HOSPITAL_COMMUNITY): Payer: Self-pay | Admitting: Obstetrics and Gynecology

## 2013-10-07 NOTE — Op Note (Signed)
Sheri Cameron, PUNCHES NO.:  [id number]  MEDICAL RECORD NO.:  [id number]  LOCATION:  [facility]                          FAC[facility]  PHYSICIAN:  Randye Lobo, M.D.   DATE OF BIRTH:  1981/10/25  DATE OF PROCEDURE:  10/06/2013 DATE OF DISCHARGE:  10/06/2013                              OPERATIVE REPORT   PREOPERATIVE DIAGNOSES: 1. Dysmenorrhea. 2. Chronic pelvic pain. 3. Uterine septum.  POSTOPERATIVE DIAGNOSES: 1. Dysmenorrhea. 2. Chronic pelvic pain. 3. Mild endometriosis. 4. Uterine septum. 5. Cervical polyp.  PROCEDURES: 1. Hysteroscopic resection of uterine septum. 2. Laparoscopy with fulguration of endometriosis. 3. Cervical polypectomy.  SURGEON:  Randye Lobo, MD  ASSISTANT:  Ivor Costa. Farrel Gobble, MD  ANESTHESIA:  General endotracheal, local with 0.25% Marcaine.  IV FLUIDS:  2500 mL Ringer's lactate.  ESTIMATED BLOOD LOSS:  50 mL.  URINE OUTPUT:  100 mL.  COMPLICATIONS:  None.  INDICATIONS FOR THE PROCEDURE:  The patient is a 32 year old gravida 2, para 0-0-2-0 Caucasian female, who presents with chronic pelvic pain, dysmenorrhea, and a history of spontaneous abortion.  During the course of the patient's evaluation, she was noted to have a septate uterus which on MRI was confirmed to be a thin myometrial septum, extending into the endocervical canal.  The patient had normal ovaries.  The patient has had negative GC and Chlamydia testing.  The patient has had a trial of oral contraceptives, NuvaRing, Ortho Evra, narcotics, and tramadol to control her pain, but her pain persists.  Most recently, she has been on continuous Ortho Evra.  The patient has had multiple trips to the emergency department due to pain exacerbations.  Recently, she was treated for presumed PID with Rocephin and doxycycline.  Her gonorrhea and Chlamydia cultures were negative.  The patient has had a history of prior colonoscopy and GI evaluation and was found to have  E. coli and a gastric ulcer.  The patient is status post appendectomy and cholecystectomy.  The plan is now made to proceed with a hysteroscopic resection of the uterine septum and a laparoscopy with treatment of possible endometriosis and adhesive disease.  Risks, benefits, and alternatives have been reviewed with the patient who wishes to proceed.  FINDINGS:  Exam under anesthesia revealed a small anteverted mobile uterus.  No adnexal masses were appreciated.  Laparoscopy demonstrated vesicular lesions of endometriosis over the superior left uterosacral ligament.  This covered a total surface area of approximately 2 cm.  No other lesions of endometriosis were appreciated.  There was no adhesive disease in the abdomen or the pelvis.  The uterine fundus had a very subtle notch to it and some broadening across the uterine fundus.  The bilateral tubes and ovaries were unremarkable.  The appendix was absent.  There was no evidence of a gallbladder.  Hysteroscopy demonstrated a septum which began at the endocervix and extended all the way up to the uterine fundus.  The septum was resected until the bilateral tubal ostial regions could be visualized with the hysteroscope.  There was no evidence of any endometrial polyps or fibroids noted.  There was a small ectocervical polyp which was removed at the termination of the procedure.  SPECIMENS:  The endocervical polyp was sent to Pathology.  DESCRIPTION OF PROCEDURE:  The patient was reidentified in the preoperative hold area.  The patient received TED hose and PAS stockings for DVT prophylaxis.  The patient was just finishing a course of doxycycline for presumed PID.  In the operating room, the patient was placed in the supine position on the operating room table.  General endotracheal anesthesia was induced. The patient was placed in the dorsal lithotomy position.  The patient was properly padded.  The arms were tucked at the  sides.  The abdomen and vagina were sterilely prepped and draped.  A Foley catheter was placed inside the bladder.  An examination under anesthesia was performed.  The procedure began by creating a vertical infraumbilical incision along the patient's previous incision.  Dissection down to the fascia was performed with an Allis clamp.  A 10-mm trocar was inserted directly into the peritoneal cavity without difficulty.  The laparoscope confirmed proper placement.  A CO2 pneumoperitoneum was achieved and the patient was then placed in the Trendelenburg position.  A 5-mm right and left lower quadrant incision were each then created with a scalpel after transillumination of the abdominal wall and local 0.25% Marcaine. Please note that the Marcaine was also used prior to creating the umbilical incision.  Each of the 5-mm trocars were placed under direct visualization of the laparoscope.  An inspection of the pelvic and abdominal organs was performed and the findings are as noted above.  A Kleppinger forceps was then used to cauterize the lesions of endometriosis after the left ureter was noted to be out of the surgical field.  Attention was then turned to the hysteroscopic resection of the uterine septum.  The speculum was placed inside the vagina and a single-tooth tenaculum was placed on the anterior cervical lip.  The uterus was sounded and the cervix was then dilated to a #19 Pratt dilator.  Under the infusion of normal saline, hysteroscopy was performed and the findings are as noted above.  Each of the tubal ostial regions were noted to be unremarkable.  The uterine septum was confirmed.  The hysteroscope was withdrawn and the cervix was then further dilated to a #25 Pratt dilator.  An operative resectoscope with the VersaPoint were then placed inside the uterine cavity under the continuous infusion of normal saline solution.  Using the Twizele stick, the uterine septum was incised  without difficulty.  As there was no significant width to the septum, it was incised and not resected.  This was performed under continuous visualization of the laparoscope from above.  The dissection continued to the level of the uterine fundus until the tubal ostial regions could be visualized simultaneously.  Hemostasis was excellent. This concluded the resection of the uterine septum.  The vaginal instruments were removed.  There was some bleeding from the anterior cervical lip which responded to compression with a ring forceps.  All vaginal instruments and the Foley catheter were removed.  The CO2 pneumoperitoneum was released and the lower abdominal trocars were removed under visualization of the laparoscope.  The umbilical trocar and the laparoscope were removed from the abdomen.  The umbilical fascia was closed with a figure-of-eight suture of 0 Vicryl.  The skin of all of the incisions was closed with subcuticular sutures of 4-0 Vicryl.  Dermabond was then placed over the incisions.  The patient was awakened and extubated and escorted to the recovery room in stable condition.  There were no complications to  the procedure.  All needle, instrument, and lap counts were correct.     Randye Lobo, M.D.     BES/MEDQ  D:  10/06/2013  T:  10/07/2013  Job:  [id number]

## 2013-10-08 ENCOUNTER — Other Ambulatory Visit: Payer: Self-pay

## 2013-10-12 ENCOUNTER — Ambulatory Visit: Payer: BC Managed Care – PPO | Admitting: Obstetrics and Gynecology

## 2013-10-12 ENCOUNTER — Telehealth: Payer: Self-pay | Admitting: Obstetrics and Gynecology

## 2013-10-12 ENCOUNTER — Other Ambulatory Visit: Payer: Self-pay

## 2013-10-12 MED ORDER — NORELGESTROMIN-ETH ESTRADIOL 150-35 MCG/24HR TD PTWK: 1.0000 | MEDICATED_PATCH | TRANSDERMAL | Status: DC

## 2013-10-12 NOTE — Telephone Encounter (Signed)
[facility] [phone]  Patient calling needing a refill on her birth control patch. She was supposed to see Dr. Edward Jolly today but is coming 10/14/13 instead.

## 2013-10-14 ENCOUNTER — Encounter: Payer: Self-pay | Admitting: Obstetrics and Gynecology

## 2013-10-14 ENCOUNTER — Ambulatory Visit (INDEPENDENT_AMBULATORY_CARE_PROVIDER_SITE_OTHER): Payer: BC Managed Care – PPO | Admitting: Obstetrics and Gynecology

## 2013-10-14 VITALS — BP 110/60 | HR 70 | Ht 59.0 in | Wt 102.5 lb

## 2013-10-14 DIAGNOSIS — Z9889 Other specified postprocedural states: Secondary | ICD-10-CM

## 2013-10-14 NOTE — Progress Notes (Signed)
Patient ID: Sheri Cameron, female   DOB: 1981/05/28, 32 y.o.   MRN: [id number]  Subjective  Patient is here for post op visit.   Status post laparoscopic fulguration of endometriosis and fulguration of left pelvic side wall endometriosis, hysteroscopic metroplasty, and removal of cervical polyp.   Notes some vaginal bleeding and watery drainage at times. Taking Percocet once or twice a day.   Wants to continue with Ortho Evra continuously.  Asking about return to work on 10/19/13.  Works at a bank.   Objective  BP 110/60   P 70  Abdomen - incisions clean, dry, intact.   Pelvic - Cervix - portion of polyp visible at os.  Unable to remove from os.  Flat. Uterus small and slightly tender. No adnexal masses.  Had mild adnexal tenderness bilaterally.  Path - benign cervical polyp.  Assessment  Doing well post op.  Plan  Follow up visit in 3 weeks. Continue Ortho Evra. Will need HSG in 2 months.

## 2013-11-03 NOTE — Telephone Encounter (Signed)
Patient needs the refill for her Birth Control  Today if possible she is completely out

## 2013-11-04 MED ORDER — NORELGESTROMIN-ETH ESTRADIOL 150-35 MCG/24HR TD PTWK: 1.0000 | MEDICATED_PATCH | TRANSDERMAL | Status: DC

## 2013-11-04 NOTE — Telephone Encounter (Signed)
Patient has f/u scheduled for 11/11/13. Rx called to pharmacy #[id number]

## 2013-11-04 NOTE — Telephone Encounter (Signed)
Patient is calling again about her bc  norelgestromin-ethinyl estradiol (ORTHO EVRA) 150-35 MCG/24HR transdermal patch  Place 1 patch onto the skin once a week., Starting 10/12/2013, Until Discontinued, Normal, Last Dose: Not Recorded  Refills: 0 ordered Pharmacy: [facility] - [place], [place] - [address]  Needs asap!!! silva didn't want her to have her period and she is now starting today

## 2013-11-11 ENCOUNTER — Ambulatory Visit (INDEPENDENT_AMBULATORY_CARE_PROVIDER_SITE_OTHER): Payer: BC Managed Care – PPO | Admitting: Obstetrics and Gynecology

## 2013-11-11 ENCOUNTER — Encounter: Payer: Self-pay | Admitting: Obstetrics and Gynecology

## 2013-11-11 VITALS — BP 98/60 | HR 64 | Resp 16 | Ht 59.0 in | Wt 102.0 lb

## 2013-11-11 DIAGNOSIS — Z30017 Encounter for initial prescription of implantable subdermal contraceptive: Secondary | ICD-10-CM

## 2013-11-11 DIAGNOSIS — Q519 Congenital malformation of uterus and cervix, unspecified: Secondary | ICD-10-CM

## 2013-11-11 DIAGNOSIS — Q51818 Other congenital malformations of uterus: Secondary | ICD-10-CM

## 2013-11-11 NOTE — Patient Instructions (Signed)
Etonogestrel implant What is this medicine? ETONOGESTREL (et oh noe JES trel) is a contraceptive (birth control) device. It is used to prevent pregnancy. It can be used for up to 3 years. This medicine may be used for other purposes; ask your health care provider or pharmacist if you have questions. COMMON BRAND NAME(S): Implanon, Nexplanon  What should I tell my health care provider before I take this medicine? They need to know if you have any of these conditions: -abnormal vaginal bleeding -blood vessel disease or blood clots -cancer of the breast, cervix, or liver -depression -diabetes -gallbladder disease -headaches -heart disease or recent heart attack -high blood pressure -high cholesterol -kidney disease -liver disease -renal disease -seizures -tobacco smoker -an unusual or allergic reaction to etonogestrel, other hormones, anesthetics or antiseptics, medicines, foods, dyes, or preservatives -pregnant or trying to get pregnant -breast-feeding How should I use this medicine? This device is inserted just under the skin on the inner side of your upper arm by a health care professional. Talk to your pediatrician regarding the use of this medicine in children. Special care may be needed. Overdosage: If you think you've taken too much of this medicine contact a poison control center or emergency room at once. Overdosage: If you think you have taken too much of this medicine contact a poison control center or emergency room at once. NOTE: This medicine is only for you. Do not share this medicine with others. What if I miss a dose? This does not apply. What may interact with this medicine? Do not take this medicine with any of the following medications: -amprenavir -bosentan -fosamprenavir This medicine may also interact with the following medications: -barbiturate medicines for inducing sleep or treating seizures -certain medicines for fungal infections like ketoconazole and  itraconazole -griseofulvin -medicines to treat seizures like carbamazepine, felbamate, oxcarbazepine, phenytoin, topiramate -modafinil -phenylbutazone -rifampin -some medicines to treat HIV infection like atazanavir, indinavir, lopinavir, nelfinavir, tipranavir, ritonavir -St. John's wort This list may not describe all possible interactions. Give your health care provider a list of all the medicines, herbs, non-prescription drugs, or dietary supplements you use. Also tell them if you smoke, drink alcohol, or use illegal drugs. Some items may interact with your medicine. What should I watch for while using this medicine? This product does not protect you against HIV infection (AIDS) or other sexually transmitted diseases. You should be able to feel the implant by pressing your fingertips over the skin where it was inserted. Tell your doctor if you cannot feel the implant. What side effects may I notice from receiving this medicine? Side effects that you should report to your doctor or health care professional as soon as possible: -allergic reactions like skin rash, itching or hives, swelling of the face, lips, or tongue -breast lumps -changes in vision -confusion, trouble speaking or understanding -dark urine -depressed mood -general ill feeling or flu-like symptoms -light-colored stools -loss of appetite, nausea -right upper belly pain -severe headaches -severe pain, swelling, or tenderness in the abdomen -shortness of breath, chest pain, swelling in a leg -signs of pregnancy -sudden numbness or weakness of the face, arm or leg -trouble walking, dizziness, loss of balance or coordination -unusual vaginal bleeding, discharge -unusually weak or tired -yellowing of the eyes or skin Side effects that usually do not require medical attention (Report these to your doctor or health care professional if they continue or are bothersome.): -acne -breast pain -changes in  weight -cough -fever or chills -headache -irregular menstrual bleeding -itching, burning,   and vaginal discharge -pain or difficulty passing urine -sore throat This list may not describe all possible side effects. Call your doctor for medical advice about side effects. You may report side effects to FDA at 1-800-FDA-1088. Where should I keep my medicine? This drug is given in a hospital or clinic and will not be stored at home. NOTE: This sheet is a summary. It may not cover all possible information. If you have questions about this medicine, talk to your doctor, pharmacist, or health care provider.  2014, Elsevier/Gold Standard. (2012-05-26 15:37:45)  

## 2013-11-11 NOTE — Progress Notes (Signed)
Patient ID: Sheri Cameron, female   DOB: 31-Oct-1981, 32 y.o.   MRN: [id number]  Subjective  Patient is her for a post op check. Status post hysteroscopic resection of uterine septum, laparoscopy with fulguration of endometriosis, and cervical polypectomy on 10/13/13. Patient is using the OrthoEvra continuously and having skin rash and reaction.  Having some vaginal bleeding. Wants to try Nexplanon and have inserted this year. Would like to proceed with HSG if possible before the end of the year.   Marrying in April and would like to consider childbearing for the end of 2014.  Objective  BP 98/60   P 64  Skin - excoriated marks on skin where patch was previously placed.  Abdomen - well healed laparoscopy incisions.  Pelvic - normal external genitalia and urethra. Cervix and vagina no lesions. Uterus small and nontender. No adnexal masses or tenderness.  Assessment  Doing well post uterine septum excision and fulguration of endometriosis.  Plan  Will precert Nexplanon and hysterosalpingogram. Will continue with Ortho Evra until Nexplanon is placed. May need quant BHCG prior to HSG. I discussed Nexplanon risks and benefits with patient.  Handout given.

## 2013-11-12 ENCOUNTER — Telehealth: Payer: Self-pay | Admitting: Obstetrics and Gynecology

## 2013-11-12 NOTE — Telephone Encounter (Signed)
Message left to return call to Cheney Ewart at [phone].    

## 2013-11-12 NOTE — Telephone Encounter (Signed)
Patient calling. She has been reading reviews online of nexplanon and is concerned that she will have increased vaginal bleeding while on nexplanon. Advised patient that everyone is different and she may not have heavy bleeding while on nexplanon, advised that since Nexplanon has a progestin hormone that may even lighten periods. Advised that it can be removed easily as well. Patient will continue to think about insertion and call back.

## 2013-11-12 NOTE — Telephone Encounter (Signed)
Patient Spoke with DR Edward Jolly yesterday about the Implanon. She wants the nurse to call her she has some questions.

## 2013-11-13 ENCOUNTER — Other Ambulatory Visit: Payer: Self-pay | Admitting: Obstetrics and Gynecology

## 2013-11-13 DIAGNOSIS — Q51818 Other congenital malformations of uterus: Secondary | ICD-10-CM

## 2013-11-16 ENCOUNTER — Other Ambulatory Visit: Payer: Self-pay | Admitting: Obstetrics and Gynecology

## 2013-11-16 DIAGNOSIS — N912 Amenorrhea, unspecified: Secondary | ICD-10-CM

## 2013-11-17 ENCOUNTER — Telehealth: Payer: Self-pay | Admitting: Emergency Medicine

## 2013-11-17 NOTE — Telephone Encounter (Addendum)
Patient calling complaining of external irritiation near ortho evra patch location. She states "I can't stand it anymore!". Having local itching and redness at site of patch. She is out of patches and scheduled for nexplanon insertion with Dr. Edward Jolly on Monday. She would like to remove patch now. Advised will need birth control prior to nexplanon insertion.   Patient is going to leave work and try benadryl. Advised no driving while taking benadryl.

## 2013-11-17 NOTE — Telephone Encounter (Signed)
Call to patient, she states she has not taken Benadryl because she can not leave work.  Reports that patch site becoming red, swollen, itchy and burning.  Has been on patch since approx June or July and reports the brand has not changed.  She is on 3rd week of patch now.  Advised with reporting these symptoms, must instruct her to discontinue patch now and if begins to have SOB and/or difficulty breathing to go to ER. Recommend she take Benadryl as soon as able and will check with MD about when to insert Nexplanon.  She is currently scheduled for Nexplanon on Monday, should this be moved up?

## 2013-11-17 NOTE — Telephone Encounter (Signed)
Will move up if starts cycle earlier.  Make sure she calls with onset of bleeding.  Thanks.

## 2013-11-17 NOTE — Telephone Encounter (Signed)
Call to patient, LMTCB.  Patient is not yet scheduled (as phone note indicates) and is interested in Nexplanon (not Mirena).  Per Dr Edward Jolly OV note, may need HCG prior to HSG being done (scheduled for 12-01-13) as post op follow-up after removal of uterine septum.

## 2013-11-17 NOTE — Telephone Encounter (Signed)
Pt is scheduled for a Mirena on Thursday. Pt states she was told that she needs blood work 48 hours before the procedure.

## 2013-11-17 NOTE — Telephone Encounter (Signed)
Returning a call to Tracy °

## 2013-11-17 NOTE — Telephone Encounter (Addendum)
Message left to return call to Ananias Kolander at [phone].    

## 2013-11-18 NOTE — Telephone Encounter (Signed)
Spoke with patient. She understands will call us back if starts menses prior to Monday, otherwise keep Nexplanon appointment for Monday. Patient was scheduled for lab draw for 12/26 (office not open that day) so lab appointment cancelled. Advised patient we can wait until appointment with Dr. Edward Jolly to find out what day she should have lab drawn prior to HSG. Decide if she should have scheduled lab draw here in office on 12/29 or have lab draw done on weekend at [facility] hospital.

## 2013-11-20 NOTE — Telephone Encounter (Signed)
Patient calling to notify office that menses started today. Advised that this should work perfectly with Nexplanon scheduled on Day 4 of cycle, 11-23-13.  Will review with provider and call her back if any changes.  Routing to provider for final review. Patient agreeable to disposition. Will close encounter

## 2013-11-23 ENCOUNTER — Encounter: Payer: Self-pay | Admitting: Obstetrics and Gynecology

## 2013-11-23 ENCOUNTER — Ambulatory Visit (INDEPENDENT_AMBULATORY_CARE_PROVIDER_SITE_OTHER): Payer: BC Managed Care – PPO | Admitting: Obstetrics and Gynecology

## 2013-11-23 VITALS — BP 98/60 | HR 70 | Resp 16 | Ht 59.0 in | Wt 104.0 lb

## 2013-11-23 DIAGNOSIS — Z30017 Encounter for initial prescription of implantable subdermal contraceptive: Secondary | ICD-10-CM

## 2013-11-23 HISTORY — PX: OTHER SURGICAL HISTORY: SHX169

## 2013-11-23 NOTE — Progress Notes (Addendum)
Patient ID: Sheri Cameron, female   DOB: 1981-06-28, 32 y.o.   MRN: [id number]  Subjective  Patient is here for Nexplanon insertion.  Stopped the Ortho Evra Patch last week due to skin irritation and blistering.  LMP started on 11/20/13.  No pain like before surgery for metroplasty and treatment of endometriosis.  Hysterosalpingogram scheduled for the end of this month.   Objective  BP 98/60   P 70 LMP  11/20/13  Nexplanon insertion.   Lot number [id number], Expiration 03/17  Consent performed.  Landmarks palpated. Sterile prep of left arm. Local 1% Lidocaine to skin. Nexplanon inserted without difficulty.  Entrance site covered with Bandaid. Nexplanon palpated by provider and patient.  Betadine cleaned off with alcohol pads. Sterile gauze wrap placed.  Tolerated well. Minimal EBL. No complications.  Assessment  Nexplanon insertion completed. Status post metroplasty and laparoscopic treatment of endometriosis.   Plan  Follow up in 6 weeks. Precautions given. Proceed with hysterosalpingogram.  Procedure reviewed with patient.

## 2013-11-23 NOTE — Patient Instructions (Signed)
Please call for bleeding, excessive bruising, fever, or redness/pain of your left arm.

## 2013-11-27 ENCOUNTER — Other Ambulatory Visit: Payer: BC Managed Care – PPO

## 2013-11-27 LAB — HCG, QUANTITATIVE, PREGNANCY: hCG, Beta Chain, Quant, S: <1 m[IU]/mL (ref <5)

## 2013-12-01 ENCOUNTER — Telehealth: Payer: Self-pay | Admitting: Obstetrics and Gynecology

## 2013-12-01 ENCOUNTER — Ambulatory Visit (HOSPITAL_COMMUNITY)
Admission: RE | Admit: 2013-12-01 | Discharge: 2013-12-01 | Disposition: A | Discharge status: Home / Self Care | Payer: BC Managed Care – PPO | Source: Ambulatory Visit | Attending: Obstetrics and Gynecology | Admitting: Obstetrics and Gynecology

## 2013-12-01 DIAGNOSIS — Q51818 Other congenital malformations of uterus: Secondary | ICD-10-CM

## 2013-12-01 DIAGNOSIS — Q519 Congenital malformation of uterus and cervix, unspecified: Secondary | ICD-10-CM | POA: Insufficient documentation

## 2013-12-01 MED ORDER — IOHEXOL 300 MG/ML  SOLN
20.0000 mL | Freq: Once | INTRAMUSCULAR | Status: AC | PRN
  Administered 2013-12-01: 20 mL

## 2013-12-01 NOTE — Telephone Encounter (Signed)
Phone call to patient.\ No answer. I left a message that her test results from today were normal. I will attempt to send a message through My Chart as well.

## 2013-12-04 ENCOUNTER — Telehealth: Payer: Self-pay | Admitting: Emergency Medicine

## 2013-12-04 NOTE — Telephone Encounter (Signed)
Message copied by Joeseph AmorFAST, Claud Gowan L on Fri Dec 04, 2013  4:49 PM ------      Message from: Ricki MillerAMUNDSON DE Gwenevere GhaziARVALHO E SILVA, BROOK E      Created: Fri Dec 04, 2013  2:47 PM      Regarding: please report normal hysterosalpingogram results       Hi French Anaracy,            Please contact this patient with her normal hysterosalpingogram test results.  I cannot reach her through My Chart.            There is no more septum and her tubes are patent.            Thanks.            Conley SimmondsBrook Silva ------

## 2013-12-21 NOTE — Telephone Encounter (Signed)
See additional messages and OV notes.  Routing to provider for final review. Patient agreeable to disposition. Will close encounter

## 2014-01-07 ENCOUNTER — Ambulatory Visit (INDEPENDENT_AMBULATORY_CARE_PROVIDER_SITE_OTHER): Payer: BC Managed Care – PPO | Admitting: Obstetrics and Gynecology

## 2014-01-07 ENCOUNTER — Encounter: Payer: Self-pay | Admitting: Obstetrics and Gynecology

## 2014-01-07 VITALS — BP 87/57 | HR 74 | Resp 16 | Wt 99.9 lb

## 2014-01-07 DIAGNOSIS — Z975 Presence of (intrauterine) contraceptive device: Secondary | ICD-10-CM

## 2014-01-07 NOTE — Patient Instructions (Signed)
Call if you need anything. Otherwise, I will see you for your routine visit in July 2015.  Happy wedding!

## 2014-01-07 NOTE — Progress Notes (Signed)
Patient ID: Sheri BookbinderMeagan N Cameron, female   DOB: 11/30/1981, 33 y.o.   MRN: [id number]  Subjective  Patient is here for a 6 week recheck since inserting Nexplanon.  Has some spotting every now and then. No pain.   Checks the Nexplanon.  Feels OK.  Wedding planning is going well.  Will consider pregnancy planning in a year or so.   Objective  Nexplanon rod intact and in the left arm. Skin well healed.  Assessment  Nexplanon insertion. Status post laparoscopy for endometriosis and resection of uterine septum.  Normal hysterosalpingogram post op. Dysmenorrhea and pelvic pain resolved.  Plan  Discusses that removal of the Nexplanon results in quick return to fertility.  I would recommend starting prenatal vitamins 3 months before removing the Nexplanon in the future.  Follow up for routine exam in July 2015. Return prn.  10 minutes face to face time of which over 50% was spent in counseling.

## 2014-01-13 NOTE — Telephone Encounter (Signed)
Patient was given results at office visit:  01/07/14 with Dr. Edward JollySilva.  Will close encounter.

## 2014-03-01 ENCOUNTER — Telehealth: Payer: Self-pay | Admitting: Obstetrics and Gynecology

## 2014-03-01 ENCOUNTER — Ambulatory Visit (INDEPENDENT_AMBULATORY_CARE_PROVIDER_SITE_OTHER): Payer: BC Managed Care – PPO | Admitting: Obstetrics and Gynecology

## 2014-03-01 ENCOUNTER — Encounter: Payer: Self-pay | Admitting: Obstetrics and Gynecology

## 2014-03-01 ENCOUNTER — Ambulatory Visit (HOSPITAL_COMMUNITY)
Admission: RE | Admit: 2014-03-01 | Discharge: 2014-03-01 | Disposition: A | Discharge status: Home / Self Care | Payer: BC Managed Care – PPO | Source: Ambulatory Visit | Attending: Obstetrics and Gynecology | Admitting: Obstetrics and Gynecology

## 2014-03-01 ENCOUNTER — Encounter: Payer: Self-pay | Admitting: Emergency Medicine

## 2014-03-01 VITALS — BP 100/70 | HR 84 | Wt 105.6 lb

## 2014-03-01 DIAGNOSIS — N83209 Unspecified ovarian cyst, unspecified side: Secondary | ICD-10-CM | POA: Insufficient documentation

## 2014-03-01 DIAGNOSIS — R1032 Left lower quadrant pain: Secondary | ICD-10-CM

## 2014-03-01 DIAGNOSIS — N809 Endometriosis, unspecified: Secondary | ICD-10-CM | POA: Insufficient documentation

## 2014-03-01 LAB — POCT URINALYSIS DIPSTICK
Bilirubin, UA: NEGATIVE
Glucose, UA: NEGATIVE
KETONES UA: NEGATIVE
Leukocytes, UA: NEGATIVE
Nitrite, UA: NEGATIVE
Protein, UA: NEGATIVE
RBC UA: NEGATIVE
UROBILINOGEN UA: NEGATIVE
pH, UA: 5

## 2014-03-01 LAB — POCT URINE PREGNANCY: PREG TEST UR: NEGATIVE

## 2014-03-01 MED ORDER — TRAMADOL HCL 50 MG PO TABS: 50.0000 mg | ORAL_TABLET | Freq: Four times a day (QID) | ORAL | Status: DC | PRN

## 2014-03-01 NOTE — Telephone Encounter (Signed)
Called patient at work. She states "I am having horrible, horrible pains like before with endometriosis." She states left sided pain that feels like it has before with endometriosis pain that started this morning at 0200. She has taken 800 mg of Motrin but that has not helped with the pain.   Offered office visit with Dr. Edward JollySilva today at 1200, patient agreeable. She will leave work for appointment.   Routing to provider for final review. Patient agreeable to disposition. Will close encounter

## 2014-03-01 NOTE — Progress Notes (Signed)
Patient ID: Sheri Cameron, female   DOB: [birth year], 33 y.o.   MRN: [id number] GYNECOLOGY VISIT  PCP:   None  Referring provider:   HPI: 33 y.o.   Single  Caucasian  female   [id number] with Patient's last menstrual period was 02/25/2014.   here for  LLQ pain since 2:00 am today.   Pain is sharp and shooting.  Having some break through bleeding.  Can have clotting.  Severe pain woke this patient up this am.  Took 800 mg ibuprofen.  No help.  Heating pad is no help.   Some nausea, but no vomiting. No fevers. Runny stools.  No one sick at home.   No dysuria.   Pain feels like LLQ pain is her usual endometriosis pain.   History of peptic ulcer.  Wedding at the end of this month.  Final planning is going well.   UPT:  Negative Urine:  Negative.  GYNECOLOGIC HISTORY: Patient's last menstrual period was 02/25/2014. Sexually active:  yes Partner preference: female Contraception: Nexplanon.  Nuvaring did no stay in well.  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  Paps normal following LEEP procedure.   OB History   Grav Para Term Preterm Abortions TAB SAB Ect Mult Living   2    2  2           There are no active problems to display for this patient.   Past Medical History  Diagnosis Date  . Anxiety   . Anemia   . Depression   . Dyspareunia   . Gastric ulcer   . Dysmenorrhea   . Asthma     rarely and mild  . [id number](478.2)     Past Surgical History  Procedure Laterality Date  . Appendectomy    . Cholecystectomy    . Dilatation & currettage/hysteroscopy with resectocope N/A  10/06/2013    Procedure: HYSTEROSCOPY WITH RESECTION OF UTERINE SEPTUM;  Surgeon: Melony Overly, MD;  Location: [facility];  Service: Gynecology;  Laterality: N/A;  resection of uterine septum.   . Laparoscopy N/A 10/06/2013    Procedure: LAPAROSCOPY DIAGNOSTIC, FULGERATION OF ENDOMETRIOSIS;  Surgeon: Melony Overly, MD;  Location: [facility];  Service: Gynecology;  Laterality: N/A;  . Cervical polypectomy N/A 10/06/2013    Procedure: CERVICAL POLYPECTOMY;  Surgeon: Melony Overly, MD;  Location: [facility];  Service: Gynecology;  Laterality: N/A;  . Nexplanon insertion Left 11/23/13    Current Outpatient Prescriptions  Medication Sig Dispense Refill  . etonogestrel (IMPLANON) 68 MG IMPL implant Inject 1 each into the skin once.      Marland Kitchen ibuprofen (ADVIL,MOTRIN) 800 MG tablet Take 800 mg by mouth every 8 (eight) hours as needed.      . sertraline (ZOLOFT) 25 MG tablet Take 50 mg by mouth at bedtime. Take 2 tabs daily      . traZODone (DESYREL) 100 MG tablet Take 50-200 mg by mouth at bedtime. 1/2 to 2 tablets at bedtime       No current facility-administered medications for this visit.  ALLERGIES: Augmentin and Erythromycin  Family History  Problem Relation Age of Onset  . Cancer Father     cholangiocarcinoma  . Breast cancer Paternal Grandmother   . Hypertension Paternal Grandmother   . Diabetes Paternal Grandmother   . Cancer Paternal Grandfather     cholangiocarcinoma/liver ca  . Cancer Maternal Grandfather     pancreatic cancer  . Ovarian cancer Maternal Aunt   . Hypertension Maternal Grandmother   . Diabetes Maternal Grandmother     History   Social History  . Marital Status: Single    Spouse Name: N/A    Number of Children: N/A  . Years of Education: N/A   Occupational History  . Not on file.   Social History Main Topics  . Smoking status: Never Smoker   . Smokeless tobacco: Never Used  . Alcohol Use: No  . Drug Use: No  . Sexual Activity: Yes    Partners: Male      Comment: Nexplanon inserted 11-23-13   Other Topics Concern  . Not on file   Social History Narrative  . No narrative on file    ROS:  Pertinent items are noted in HPI.  PHYSICAL EXAMINATION:    BP 100/70  Pulse 84  Wt 105 lb 9.6 oz (47.9 kg)  LMP 02/25/2014   Wt Readings from Last 3 Encounters:  03/01/14 105 lb 9.6 oz (47.9 kg)  01/07/14 99 lb 14.4 oz (45.314 kg)  11/23/13 104 lb (47.174 kg)     Ht Readings from Last 3 Encounters:  11/23/13 4\' 11"  (1.499 m)  11/11/13 4\' 11"  (1.499 m)  10/14/13 4\' 11"  (1.499 m)    General appearance: alert, cooperative and appears stated age.  No acute distress. Neurologic: Grossly normal Abdomen:  Soft and mildly tender in LLQ.  No masses. Pelvic: External genitalia:  no lesions              Urethra:  normal appearing urethra with no masses, tenderness or lesions              Bartholins and Skenes: normal                 Vagina: normal appearing vagina with normal color and discharge, no lesions              Cervix: normal appearance                 Bimanual Exam:  Uterus:  uterus is normal size, shape, consistency and nontender                                      Adnexa: normal adnexa in size, mildly tender in LLQ and no masses        Left upper arm - Nexplanon intact.                                 ASSESSMENT  LLQ pain.  No acute abdomen.  History of endometriosis. Status post laparoscopy with treatment of endometriosis and excision of uterine septum.  Nexplanon patient.   PLAN  [facility]. Tramadol 50 mg po q 6 hours prn pain.  #20.  RF zero.  An After Visit Summary was printed and given to the patient.  15 minutes face to face time of which over  50% was spent in counseling.

## 2014-03-01 NOTE — Telephone Encounter (Signed)
Phone call to discuss pelvic ultrasound results.  Uterus with small amount of fluid noted.  Some distortion of endometrial cavity due to myometrial resection.  Left ovary normal. Right ovary with simple 2.6 cm cyst.  No free fluid.   Reassurance given.  Tramadol Rx given.   Return for continued or increasing pain, fever, or heavy bleeding.   Patient agrees.

## 2014-03-01 NOTE — Telephone Encounter (Signed)
Patient calling to speak with nurse about "sharp pelvic pains" she is having where she "had surgery for endometriosis." She reports she has taken 800 mg ibuprofen so far.

## 2014-03-17 ENCOUNTER — Encounter: Payer: Self-pay | Admitting: Obstetrics and Gynecology

## 2014-04-13 ENCOUNTER — Ambulatory Visit (INDEPENDENT_AMBULATORY_CARE_PROVIDER_SITE_OTHER): Payer: BC Managed Care – PPO

## 2014-04-13 ENCOUNTER — Encounter: Payer: Self-pay | Admitting: Gynecology

## 2014-04-13 ENCOUNTER — Telehealth: Payer: Self-pay | Admitting: Obstetrics and Gynecology

## 2014-04-13 ENCOUNTER — Other Ambulatory Visit: Payer: Self-pay | Admitting: *Deleted

## 2014-04-13 ENCOUNTER — Ambulatory Visit (INDEPENDENT_AMBULATORY_CARE_PROVIDER_SITE_OTHER): Payer: BC Managed Care – PPO | Admitting: Gynecology

## 2014-04-13 VITALS — BP 116/66 | HR 64 | Resp 20 | Ht 59.0 in | Wt 104.0 lb

## 2014-04-13 DIAGNOSIS — R1031 Right lower quadrant pain: Secondary | ICD-10-CM | POA: Insufficient documentation

## 2014-04-13 DIAGNOSIS — R102 Pelvic and perineal pain: Secondary | ICD-10-CM

## 2014-04-13 DIAGNOSIS — R52 Pain, unspecified: Secondary | ICD-10-CM

## 2014-04-13 DIAGNOSIS — N949 Unspecified condition associated with female genital organs and menstrual cycle: Secondary | ICD-10-CM

## 2014-04-13 DIAGNOSIS — N809 Endometriosis, unspecified: Secondary | ICD-10-CM | POA: Insufficient documentation

## 2014-04-13 LAB — POCT URINALYSIS DIPSTICK
BILIRUBIN UA: NEGATIVE
GLUCOSE UA: NEGATIVE
KETONES UA: NEGATIVE
Leukocytes, UA: NEGATIVE
NITRITE UA: NEGATIVE
PH UA: 5
Protein, UA: NEGATIVE
Urobilinogen, UA: NEGATIVE

## 2014-04-13 LAB — POCT URINE PREGNANCY: Preg Test, Ur: NEGATIVE

## 2014-04-13 MED ORDER — AMITRIPTYLINE HCL 25 MG PO TABS: 25.0000 mg | ORAL_TABLET | Freq: Every day | ORAL | Status: DC

## 2014-04-13 NOTE — Telephone Encounter (Signed)
Spoke with patient at time of incoming call. She is at work currently and calling in to report LLQ pain with 3 days of vaginal bleeding.  She feels that this is pain is not different from other episodes of LLQ pain that she has been evaluated for prior. She states that she is passing clots that range in size from "very small to dime size." Patient states she is having to change her pad q two hours. States she is feeling weak. Advised patient that I would speak with Dr. Farrel GobbleLathrop (Dr. Edward JollySilva not in office) and would call her back with office visit time or further instructions. Patient states she is ten minutes away. I advised patient if she is feeling weak that she should not drive and patient states she feels that she can drive because she is close to our office. I again advised patient that she should find someone who can drive her to office visit.   1047: Message left to return call to Loviliaracy at [phone].   1100: Patient returned call. Advised would like her to come for office visit with Dr. Farrel GobbleLathrop at 1330. Patient is agreeable. Advised if anything worsens to call our office back. She verbalizes understanding.   Routing to Dr. Farrel GobbleLathrop for review.    cc Dr. Edward JollySilva

## 2014-04-13 NOTE — Progress Notes (Signed)
Subjective:     Patient ID: Sheri Cameron, female   DOB: 01/26/1981, 33 y.o.   MRN: [id number]  HPI Comments: Pt reports acute onset of pain that radiates to right upper thigh and pressure that started at work today.  She started bleeding 5/3 and is still bleeding,   She has a nexplanon in place. [facility]-PUS normal, reports these symptoms are worse. Images of [facility] both reviewed  Pelvic Pain The patient's primary symptoms include pelvic pain. This is a recurrent problem. The current episode started today. The problem has been gradually worsening. The pain is severe. The problem affects the right side. She is not pregnant. Associated symptoms include nausea. Pertinent negatives include no dysuria, fever, frequency or vomiting. Exacerbated by: movement. She has tried nothing for the symptoms. Contraceptive use: nexplanon placed 2015. Her past medical history is significant for a gynecological surgery (metroplasty and laparoscopy for endometriosis).     Review of Systems  Constitutional: Negative for fever.  Gastrointestinal: Positive for nausea. Negative for vomiting.  Genitourinary: Positive for pelvic pain. Negative for dysuria and frequency.       Objective:   Physical Exam  Nursing note and vitals reviewed. Constitutional: Vital signs are normal. She appears well-developed and well-nourished.  Non-toxic appearance. She appears ill. No distress.  Musculoskeletal:       Arms:   Pelvic: External genitalia:  no lesions              Urethra:  normal appearing urethra with no masses, tenderness or lesions              Bartholins and Skenes: normal                 Vagina: normal appearing vagina with normal color and discharge, no lesions              Cervix: normal appearance                  Bimanual Exam:  Uterus:  uterus is normal size, shape, consistency and nontender                                      Adnexa: normal adnexa in size,  nontender and no masses                                      Rectovaginal: tenderness of LUS, pelvic sidewalls and levators                                      Anus:  normal sphincter tone, no lesions     Assessment:       PUS images reviewed, uterus with small residual fundal defect, blood in cavity, ovaries normal, large follicle on right.      Plan:      Chronic pelvic pain Endometriosis septated uterus corrected Normal pelvic u/s.  Pt is on nexplanon for ovarian suppression, daily pain exacerbated by bleeding.  Discussed bleeding profile on nexplanon.  Offered other options for pain management ; elavil, neurontin or cymbalta. Modes of action reviewed. We will start elavil this weekend, she is to stop the trazadone.   Reassess in 4w  Agrees to plan     In total, 3122m spent with pt, >50% face to face

## 2014-04-13 NOTE — Telephone Encounter (Signed)
Pt is passing huge clots and in severe pain.

## 2014-04-13 NOTE — Patient Instructions (Signed)
Stop the trazadone, start elavil 25mg  at bedtime F/u 4w

## 2014-05-20 ENCOUNTER — Telehealth: Payer: Self-pay | Admitting: Gynecology

## 2014-05-20 NOTE — Telephone Encounter (Signed)
Patient canceled her 4 week reck appointment  05/21/14. Patient had a family emergency and will call later to reschedule.

## 2014-05-21 ENCOUNTER — Ambulatory Visit: Payer: BC Managed Care – PPO | Admitting: Gynecology

## 2014-06-25 ENCOUNTER — Ambulatory Visit: Payer: BC Managed Care – PPO | Admitting: Obstetrics and Gynecology

## 2014-07-02 ENCOUNTER — Ambulatory Visit: Payer: BC Managed Care – PPO | Admitting: Obstetrics and Gynecology

## 2014-07-16 IMAGING — US US TRANSVAGINAL NON-OB
1 series · 13 of 25 positions shown · non-contrast
Comparison: None.

CLINICAL DATA: Left lower quadrant pain. Endometriosis. Previous
hysteroscopic resection of uterine myometrial septum.

EXAM:
TRANSVAGINAL ULTRASOUND OF PELVIS
TECHNIQUE: Transvaginal ultrasound examination of the pelvis was performed
including evaluation of the uterus, ovaries, adnexal regions, and
pelvic cul-de-sac.

[Series 1: us transvaginal non-ob · 13 of 35 slices shown]
[im 1/35]
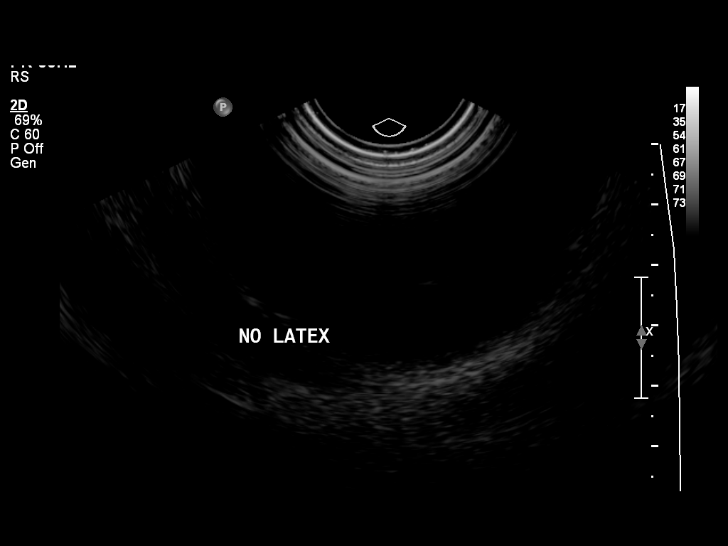
[im 3/35]
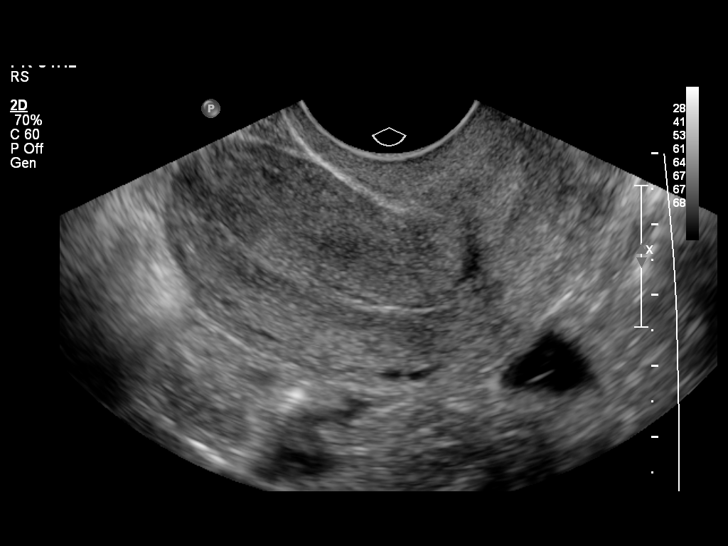
[im 6/35]
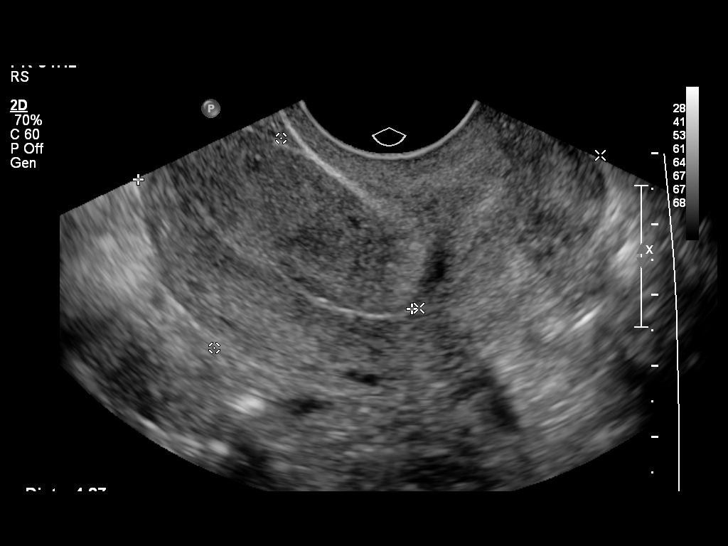
[im 9/35]
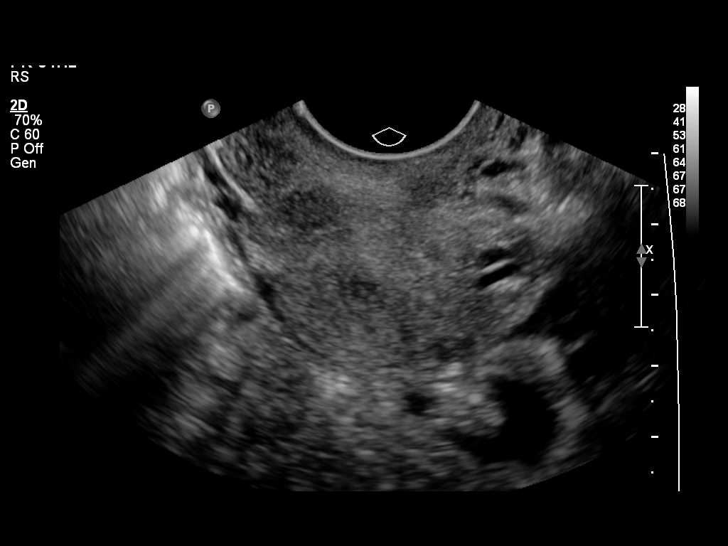
[im 12/35]
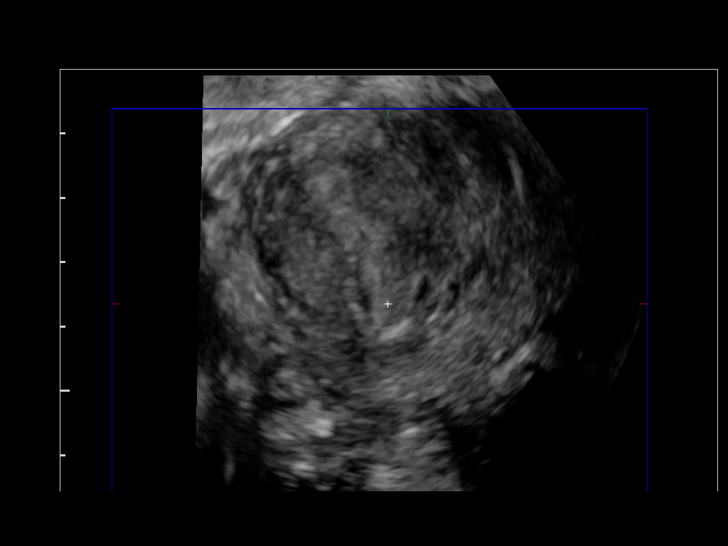
[im 15/35]
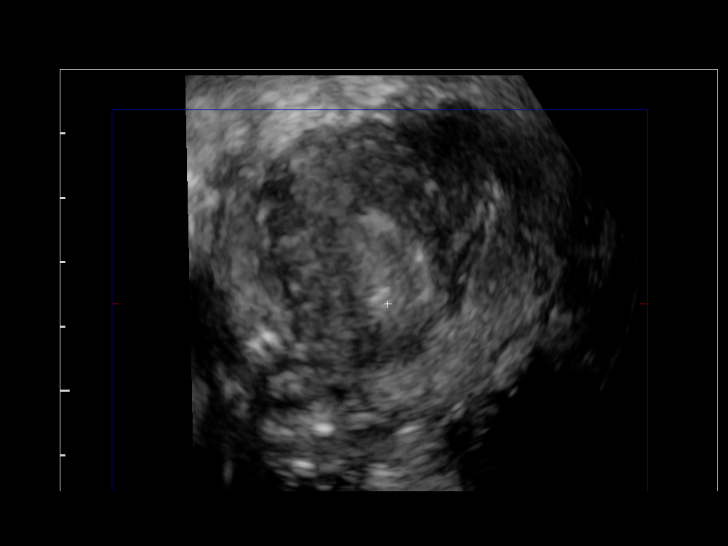
[im 18/35]
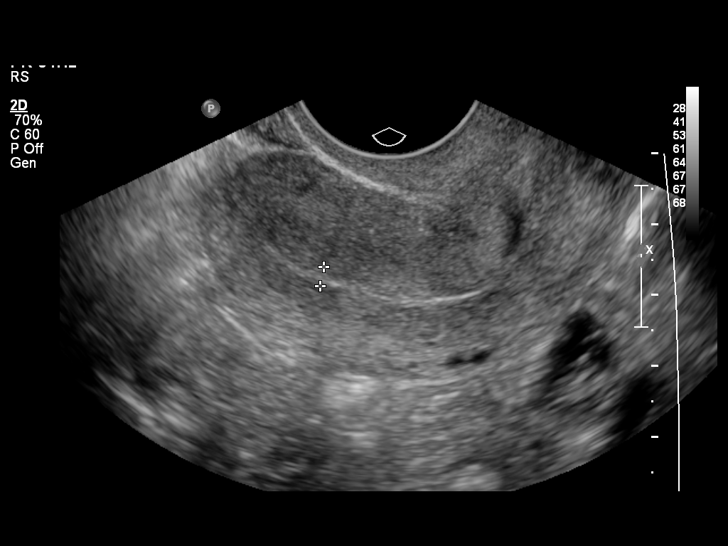
[im 20/35]
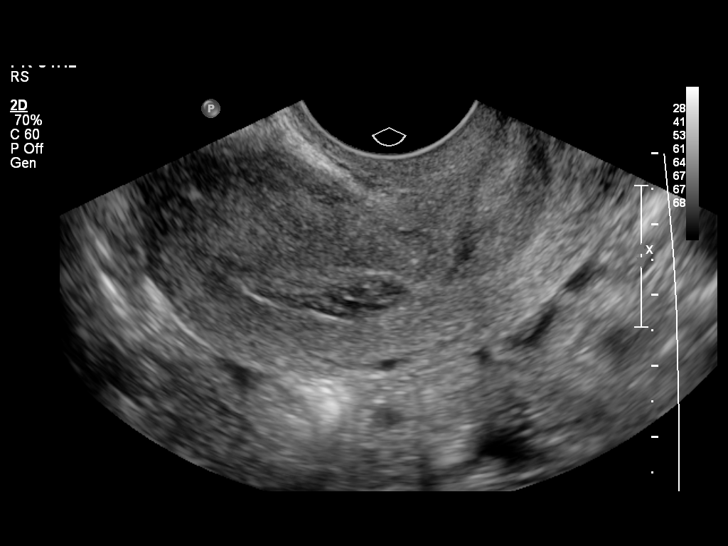
[im 23/35]
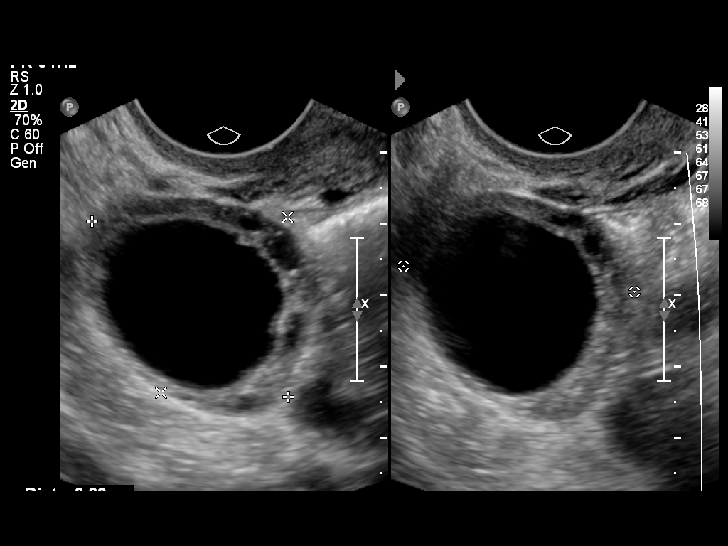
[im 26/35]
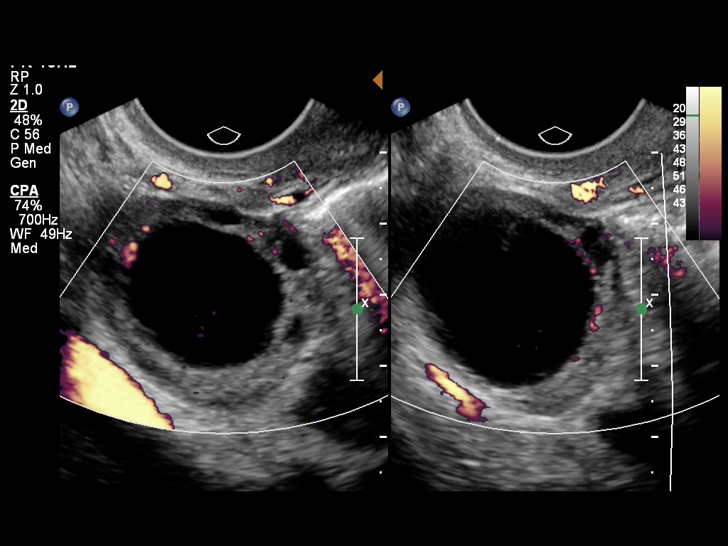
[im 29/35]
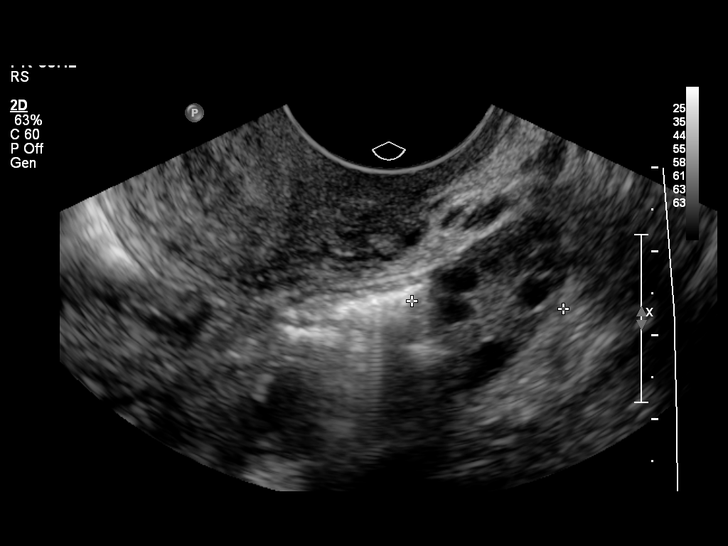
[im 32/35]
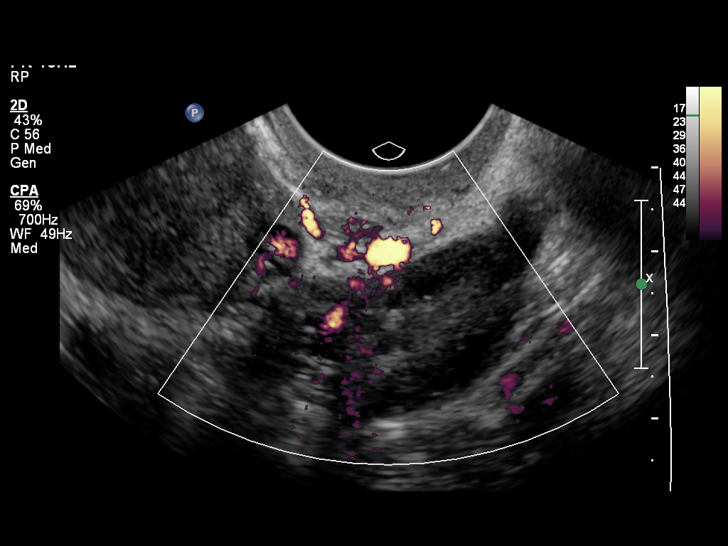
[im 35/35]
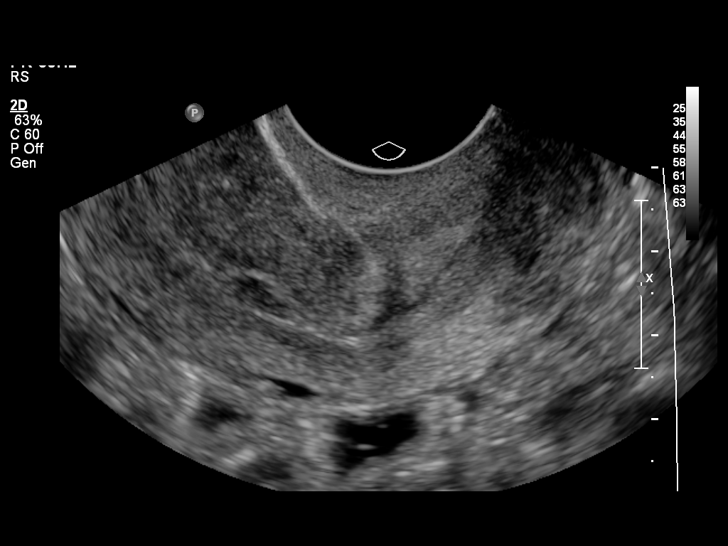

[13 of 25 positions shown; findings below may reference images not displayed]

FINDINGS: Uterus

Measurements: 7.7 x 3.1 x 4.7 cm. No fibroids or other mass
visualized.

Endometrium

Thickness: 3 mm. Endometrial cavity appears somewhat distorted
likely due to previous surgical resection of myometrial septum.
Small amount of complex fluid or blood noted in the left side of the
endometrial cavity.

Right ovary

Measurements: 3.7 x 3.0 x 3.3 cm. 2.6 cm simple cyst or follicle
noted. No adnexal mass.

Left ovary

Measurements: 2.9 x 1.7 x 1.8 cm. Normal appearance/no adnexal mass.

Other findings:  No free fluid
IMPRESSION: Small amount of hypoechoic fluid or blood noted in the endometrial
cavity, which is of uncertain clinical significance. No fibroids
identified.

Normal appearance of both ovaries, with 2.6 cm right ovarian
follicle noted.

No pelvic mass or free fluid identified.

These results were called by telephone at the time of interpretation
on 03/01/2014 at [DATE] to Dr. CRAWFORD SEO , who verbally
acknowledged these results.

## 2014-07-28 ENCOUNTER — Ambulatory Visit: Payer: BC Managed Care – PPO | Admitting: Obstetrics and Gynecology

## 2014-08-04 ENCOUNTER — Ambulatory Visit (INDEPENDENT_AMBULATORY_CARE_PROVIDER_SITE_OTHER): Payer: BC Managed Care – PPO | Admitting: Certified Nurse Midwife

## 2014-08-04 ENCOUNTER — Telehealth: Payer: Self-pay | Admitting: Obstetrics and Gynecology

## 2014-08-04 ENCOUNTER — Encounter: Payer: Self-pay | Admitting: Certified Nurse Midwife

## 2014-08-04 VITALS — BP 110/70 | HR 68 | Resp 16 | Ht 59.0 in | Wt 110.0 lb

## 2014-08-04 DIAGNOSIS — R42 Dizziness and giddiness: Secondary | ICD-10-CM

## 2014-08-04 DIAGNOSIS — IMO0001 Reserved for inherently not codable concepts without codable children: Secondary | ICD-10-CM

## 2014-08-04 DIAGNOSIS — Z711 Person with feared health complaint in whom no diagnosis is made: Secondary | ICD-10-CM

## 2014-08-04 LAB — HEMOGLOBIN, FINGERSTICK: HEMOGLOBIN, FINGERSTICK: 13.8 g/dL (ref 12.0–16.0)

## 2014-08-04 NOTE — Telephone Encounter (Signed)
Patient is very concerned and is wondering if she needs to be seen.

## 2014-08-04 NOTE — Progress Notes (Signed)
Reviewed personally.  M. Suzanne Isbella Arline, MD.  

## 2014-08-04 NOTE — Progress Notes (Signed)
  33 y.o.MarriedCaucasianfemale presents with history of endometrosis with Nexplanon for cycle control. Patient here due to concern with bleeding amount and felt " light headed" earlier today. Patient has eaten breakfast(biscuit) and fajita for lunch. Has been drinking water all morning. Periods for the past two months have been heavy spotting with 1-2 pad use in a 24 hour period. This period has been heavier with one pad every 2-4 hours, which was every 2-3 hours this am at work. Patient had picture of 2 small clots on tissue that she passed this am, dark in color. No cramping. Does not feel light headed at present. No other concerns. Patient just wanted to make sure all normal.  O: Healthy WDWN female in no apparent distress Orientation x 3, alert Color good BP: 110/70 P 68 Skin : warm and dry Left arm: Nexplanon noted in place intact Abdomen: soft, non tender External genital area: normal appearance, no lesions BUS: negative Vagina: very small amount of dark blood noted in vaginal vault and scant from cervix. Applied large Q tip to cervix and did not saturate it in 5 minute period.  Cervix non tender, no lesions Uterus: non tender, normal Adnexa: non tender, normal, no masses palpated Patient had been sitting with out pad on chux blue pad for 20 minutes with scant blood noted. Ambulates without dizziness or change in color. BP recheck prior to leaving: 110/72 P: 70 R: 18  Hgb. 13.8   Assessment: Normal pelvic exam Bleeding not excessive for normal period Nexplanon in place, intact. History of Endometrosis  Plan: Discussed with patient normal findings and not excessive bleeding per description or on exam. Discussed concerns with period heavier this month than previous and expectations with Nexplanon use and bleeding profile. Patient acknowledges periods much better with use. 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  Discussed warning signs of excessive bleeding and to advise if occurs and that small clotting is not a problem and what is a problem. Questions addressed at length. Color maintained good during exam and when ambulating.Denied any dizziness. Patient not complaining of any changes. Discussed importance of fluids and food on regular basis. Patient states "she feels much better and reassured.  Rv prn

## 2014-08-04 NOTE — Patient Instructions (Signed)
Menstruation °Menstruation is the monthly passing of blood, tissue, fluid and mucus, also know as a period. Your body is shedding the lining of the uterus. The flow, or amount of blood, usually lasts from 3-7 days each month. Hormones control the menstrual cycle. Hormones are a chemical substance produced by endocrine glands in the body to regulate different bodily functions. °The first menstrual period may start any time between age 31 years to 16 years. However, it usually starts around age 12 years. Some girls have regular monthly menstrual cycles right from the beginning. However, it is not unusual to have only a couple of drops of blood or spotting when you first start menstruating. It is also not unusual to have two periods a month or miss a month or two when first starting your periods. °SYMPTOMS  °· Mild to moderate abdominal cramps. °· Aching or pain in the lower back area. °Symptoms may occur 5-10 days before your menstrual period starts. These symptoms are referred to as premenstrual syndrome (PMS). These symptoms can include: °· Headache. °· Breast tenderness and swelling. °· Bloating. °· Tiredness (fatigue). °· Mood changes. °· Craving for certain foods. °These are normal signs and symptoms and can vary in severity. To help relieve these problems, ask your caregiver if you can take over-the-counter medications for pain or discomfort. If the symptoms are not controllable, see your caregiver for help.  °HORMONES INVOLVED IN MENSTRUATION °Menstruation comes about because of hormones produced by the pituitary gland in the brain and the ovaries that affect the uterine lining. °First, the pituitary gland in the brain produces the hormone follicle stimulating hormone (FSH). FSH stimulates the ovaries to produce estrogen, which thickens the uterine lining and begins to develop an egg in the ovary. About 14 days later, the pituitary gland produces another hormone called luteinizing hormone (LH). LH causes the egg  to come out of a sac in the ovary (ovulation). The empty sac on the ovary called the corpus luteum is stimulated by another hormone from the pituitary gland called luteotropin. The corpus luteum begins to produce the estrogen and progesterone hormone. The progesterone hormone prepares the lining of the uterus to have the fertilized egg (egg combined with sperm) attach to the lining of the uterus and begin to develop into a fetus. If the egg is not fertilized, the corpus luteum stops producing estrogen and progesterone, it disappears, the lining of the uterus sloughs off and a menstrual period begins. Then the menstrual cycle starts all over again and will continue monthly unless pregnancy occurs or menopause begins. °The secretion of hormones is complex. Various parts of the body become involved in many chemical activities. Female sex hormones have other functions in a woman's body as well. Estrogen increases a woman's sex drive (libido). It naturally helps body get rid of fluids (diuretic). It also aids in the process of building new bone. Therefore, maintaining hormonal health is essential to all levels of a woman's well being. These hormones are usually present in normal amounts and cause you to menstruate. It is the relationship between the (small) levels of the hormones that is critical. When the balance is upset, menstrual irregularities can occur. °HOW DOES THE MENSTRUAL CYCLE HAPPEN? °· Menstrual cycles vary in length from 21-35 days with an average of 29 days. The cycle begins on the first day of bleeding. At this time, the pituitary gland in the brain releases FSH that travels through the bloodstream to the ovaries. The FSH stimulates the follicles in the   ovaries. This prepares the body for ovulation that occurs around the 14th day of the cycle. The ovaries produce estrogen, and this makes sure conditions are right in the uterus for implantation of the fertilized egg. °· When the levels of estrogen reach a  high enough level, it signals the gland in the brain (pituitary gland) to release a surge of LH. This causes the release of the ripest egg from its follicle (ovulation). Usually only one follicle releases one egg, but sometimes more than one follicle releases an egg especially when stimulating the ovaries for in vitro fertilization. The egg can then be collected by either fallopian tube to await fertilization. The burst follicle within the ovary that is left behind is now called the corpus luteum or "yellow body." The corpus luteum continues to give off (secrete) reduced amounts of estrogen. This closes and hardens the cervix. It dries up the mucus to the naturally infertile condition. °· The corpus luteum also begins to give off greater amounts of progesterone. This causes the lining of the uterus (endometrium) to thicken even more in preparation for the fertilized egg. The egg is starting to journey down from the fallopian tube to the uterus. It also signals the ovaries to stop releasing eggs. It assists in returning the cervical mucus to its infertile state. °· If the egg implants successfully into the womb lining and pregnancy occurs, progesterone levels will continue to raise. It is often this hormone that gives some pregnant women a feeling of well being, like a "natural high." Progesterone levels drop again after childbirth. °· If fertilization does not occur, the corpus luteum dies, stopping the production of hormones. This sudden drop in progesterone causes the uterine lining to break down, accompanied by blood (menstruation). °· This starts the cycle back at day 1. The whole process starts all over again. Woman go through this cycle every month from puberty to menopause. Women have breaks only for pregnancy and breastfeeding (lactation), unless the woman has health problems that affect the female hormone system or chooses to use oral contraceptives to have unnatural menstrual periods. °HOME CARE  INSTRUCTIONS  °· Keep track of your periods by using a calendar. °· If you use tampons, get the least absorbent to avoid toxic shock syndrome. °· Do not leave tampons in the vagina over night or longer than 6 hours. °· Wear a sanitary pad over night. °· Exercise 3-5 times a week or more. °· Avoid foods and drinks that you know will make your symptoms worse before or during your period. °SEEK MEDICAL CARE IF:  °· You develop a fever with your period. °· Your periods are lasting more than 7 days. °· Your period is so heavy that you have to change pads or tampons every 30 minutes. °· You develop clots with your period and never had clots before. °· You cannot get relief from over-the-counter medication for your symptoms. °· Your period has not started, and it has been longer than 35 days. °Document Released: 11/09/2002 Document Revised: 11/24/2013 Document Reviewed: 06/18/2013 °[employer]® Patient Information ©2015 [employer]. This information is not intended to replace advice given to you by your health care provider. Make sure you discuss any questions you have with your health care provider. ° °

## 2014-08-04 NOTE — Telephone Encounter (Signed)
Spoke with patient at time of incoming call. Patient has nexplanon and has been experiencing heavy bleeding for three days. "I am changing my pad every 2-3 hours. This has never happened before. I am starting to feel weak. I have been having a lot of clots too." States when she gets up she feels light headed sometimes. Patient is concerned. Advised will need to be seen in office for evaluation. Patient is agreeable. Appointment scheduled for today at 11:15am with Verner Chol CNM. Patient agreeable to date and time.  Routing to Verner Chol CNM  Cc: Dr.Silva  Routing to provider for final review. Patient agreeable to disposition. Will close encounter

## 2014-08-05 ENCOUNTER — Ambulatory Visit: Payer: BC Managed Care – PPO | Admitting: Obstetrics and Gynecology

## 2014-09-08 ENCOUNTER — Ambulatory Visit: Payer: BC Managed Care – PPO | Admitting: Obstetrics and Gynecology

## 2014-10-04 ENCOUNTER — Encounter: Payer: Self-pay | Admitting: Certified Nurse Midwife

## 2014-12-23 ENCOUNTER — Ambulatory Visit: Payer: BC Managed Care – PPO | Admitting: Obstetrics and Gynecology

## 2015-01-12 ENCOUNTER — Encounter: Payer: Self-pay | Admitting: Obstetrics and Gynecology

## 2015-01-12 ENCOUNTER — Ambulatory Visit (INDEPENDENT_AMBULATORY_CARE_PROVIDER_SITE_OTHER): Payer: BLUE CROSS/BLUE SHIELD | Admitting: Obstetrics and Gynecology

## 2015-01-12 ENCOUNTER — Telehealth: Payer: Self-pay | Admitting: Obstetrics and Gynecology

## 2015-01-12 VITALS — BP 128/84 | HR 90 | Temp 97.7°F | Resp 18 | Ht 59.0 in | Wt 120.0 lb

## 2015-01-12 DIAGNOSIS — R102 Pelvic and perineal pain: Secondary | ICD-10-CM

## 2015-01-12 DIAGNOSIS — R319 Hematuria, unspecified: Secondary | ICD-10-CM

## 2015-01-12 DIAGNOSIS — N946 Dysmenorrhea, unspecified: Secondary | ICD-10-CM

## 2015-01-12 LAB — POCT URINALYSIS DIPSTICK
LEUKOCYTES UA: NEGATIVE
Urobilinogen, UA: NEGATIVE
pH, UA: 5

## 2015-01-12 LAB — POCT URINE PREGNANCY: PREG TEST UR: NEGATIVE

## 2015-01-12 MED ORDER — TRAMADOL HCL 50 MG PO TABS: 50.0000 mg | ORAL_TABLET | Freq: Four times a day (QID) | ORAL | Status: DC | PRN

## 2015-01-12 NOTE — Telephone Encounter (Signed)
Spoke with patient. Patient states that since yesterday she has been experiencing right lower abdominal and pelvic pain. States that pain is "intense and has be doubled over." Pain increases with movements. Patient is nauseated but has not had any vomiting. Took one tramadol today at 6am. Has nexplanon in place for birth control. Patient states she has been randomly passing blood clots since yesterday. Is unsure when cycle is supposed to start. "I never know if I will have one because of the nexplanon." Patient had ultrasound on 04/13/2014 with Dr.Lathop. Has history of endometriosis. Advised will need to speak with Dr.Silva and return call with recommendations regarding evaluation. Patient is agreeable.

## 2015-01-12 NOTE — Telephone Encounter (Signed)
Spoke with patient. Advised patient that I spoke with Dr.Silva who recommends that patient come in to be seen in office. Patient is agreeable. Appointment scheduled for 3pm with Dr.Silva for work in. Patient is agreeable.  Routing to provider for final review. Patient agreeable to disposition. Will close encounter

## 2015-01-12 NOTE — Patient Instructions (Signed)
Please call if you have worsening pain or heavy vaginal bleeding.  Otherwise, I will see you on 01/17/15 for an office visit.

## 2015-01-12 NOTE — Telephone Encounter (Signed)
Patient is in extreme pain and wants to come in today

## 2015-01-12 NOTE — Progress Notes (Signed)
GYNECOLOGY  VISIT   HPI: 34 y.o.   Married  Caucasian  female   [id number] with Patient's last menstrual period was 01/11/2015.   here for pelvic pain alternating between right and left lower quadrant.  Pain started yesterday.  Sudden onset while sitting at work.  Stabbing pain, can radiate to the left or down her right leg.  Took tramadol yesterday and pain resolved.  Had some blood clots yesterday when pain began.  No heavy ongoing bleeding.  Some nausea. No vomiting.  Diarrhea.  No fevers.  No dysuria.   Does not have gall bladder or appendix.   Had Nexplanon placed 11/2014.  Had only menses several months ago.  Has been pain free until now.   Weaning off Zoloft.  Will try for pregnancy in August and need Nexplanon removed.  UPT negative.   GYNECOLOGIC HISTORY: Patient's last menstrual period was 01/11/2015.        OB History    Gravida Para Term Preterm AB TAB SAB Ectopic Multiple Living   2    2  2             Patient Active Problem List   Diagnosis Date Noted  . Pelvic pain in female 04/13/2014  . Endometriosis 04/13/2014  . Acute right lower quadrant pain 04/13/2014    Past Medical History  Diagnosis Date  . Anxiety   . Anemia   . Depression   . Dyspareunia   . Gastric ulcer   . Dysmenorrhea   . Asthma     rarely and mild  . Headache(784.0)   . Endometriosis     Past Surgical History  Procedure Laterality Date  . Appendectomy    . Cholecystectomy    . Dilatation & currettage/hysteroscopy with resectocope N/A 10/06/2013    Procedure: HYSTEROSCOPY WITH RESECTION OF UTERINE SEPTUM;  Surgeon: Melony OverlyBrook A Silva, MD;  Location: [facility];  Service: Gynecology;  Laterality: N/A;  resection of uterine septum.   . Laparoscopy N/A 10/06/2013    Procedure: LAPAROSCOPY DIAGNOSTIC, FULGERATION OF ENDOMETRIOSIS;  Surgeon: Melony OverlyBrook A Silva, MD;  Location: [facility];  Service: Gynecology;  Laterality: N/A;  . Cervical polypectomy N/A 10/06/2013    Procedure: CERVICAL  POLYPECTOMY;  Surgeon: Melony OverlyBrook A Silva, MD;  Location: [facility];  Service: Gynecology;  Laterality: N/A;  . Nexplanon insertion Left 11/23/13    Current Outpatient Prescriptions  Medication Sig Dispense Refill  . etonogestrel (NEXPLANON) 68 MG IMPL implant Inject 1 each into the skin once.    . sertraline (ZOLOFT) 25 MG tablet Take 50 mg by mouth at bedtime. Take 2 tabs daily     No current facility-administered medications for this visit.     ALLERGIES: Augmentin and Erythromycin  Family History  Problem Relation Age of Onset  . Cancer Father     cholangiocarcinoma  . Breast cancer Paternal Grandmother   . Hypertension Paternal Grandmother   . Diabetes Paternal Grandmother   . Cancer Paternal Grandfather     cholangiocarcinoma/liver ca  . Cancer Maternal Grandfather     pancreatic cancer  . Ovarian cancer Maternal Aunt   . Hypertension Maternal Grandmother   . Diabetes Maternal Grandmother     History   Social History  . Marital Status: Married    Spouse Name: N/A  . Number of Children: N/A  . Years of Education: N/A   Occupational History  . Not on file.   Social History Main Topics  . Smoking status: Never Smoker   .  Smokeless tobacco: Never Used  . Alcohol Use: No  . Drug Use: No  . Sexual Activity:    Partners: Male     Comment: Nexplanon inserted 11-23-13   Other Topics Concern  . Not on file   Social History Narrative    ROS:  Pertinent items are noted in HPI.  PHYSICAL EXAMINATION:    BP 128/84 mmHg  Pulse 90  Temp(Src) 97.7 F (36.5 C) (Oral)  Resp 18  Ht  (1.499 m)  Wt 120 lb (54.432 kg)  BMI 24.22 kg/m2  LMP 01/11/2015     General appearance: alert, cooperative and appears stated age Lungs: clear to auscultation bilaterally Heart: regular rate and rhythm Abdomen: soft, mildly tender; no masses,  no organomegaly No abnormal inguinal nodes palpated Left arm:  Nexplanon intact.   Pelvic: External genitalia:  no lesions               Urethra:  normal appearing urethra with no masses, tenderness or lesions              Bartholins and Skenes: normal                 Vagina: normal appearing vagina with normal color and yellowish brown blood, no lesions              Cervix: normal appearance.  No cervical motion tenderness.                    Bimanual Exam:  Uterus:  uterus is normal size, shape, consistency and nontender                                      Adnexa: normal adnexa in size, tender right adnexa, no masses                                   ASSESSMENT  Pelvic pain.  Dysmenorrhea.  History of endometriosis.  Status post metroplasty.  Nexplanon patient. Microscopic hematuria. Probable contaminant.  Desire for future fertility.   PLAN  Rx for Tramadol 50 mg po q 6 hours prn.  #15, RF zero.  Return for increasing pain.  Check urine micro and culture.  Preconception counseling.  Start PNV.  Wean off Zoloft through prescribing provider.  Avoid exposures.  Schedule removal of Nexplanon and further discussion of pregnancy planning next summer.  Recheck in 5 days.    An After Visit Summary was printed and given to the patient.  _25_____ minutes face to face time of which over 50% was spent in counseling.

## 2015-01-13 LAB — URINE CULTURE
Colony Count: NO GROWTH
ORGANISM ID, BACTERIA: NO GROWTH

## 2015-01-13 LAB — URINALYSIS, MICROSCOPIC ONLY
Casts: NONE SEEN
Squamous Epithelial / LPF: NONE SEEN

## 2015-01-17 ENCOUNTER — Ambulatory Visit: Payer: BLUE CROSS/BLUE SHIELD | Admitting: Obstetrics and Gynecology

## 2015-01-17 ENCOUNTER — Telehealth: Payer: Self-pay | Admitting: Obstetrics and Gynecology

## 2015-01-17 NOTE — Telephone Encounter (Signed)
This patient needs to reschedule her 5 day recheck appointment with Dr. Edward JollySilva that was cancelled today due to weather.

## 2015-01-18 NOTE — Telephone Encounter (Signed)
Patient is anxious for next appointment date.

## 2015-01-18 NOTE — Telephone Encounter (Signed)
Return call to patient. Rescheduled appointment for 01/19/2015 at 1 PM.  Routing to provider for final review. Patient agreeable to disposition. Will close encounter

## 2015-01-19 ENCOUNTER — Encounter: Payer: Self-pay | Admitting: Obstetrics and Gynecology

## 2015-01-19 ENCOUNTER — Ambulatory Visit (INDEPENDENT_AMBULATORY_CARE_PROVIDER_SITE_OTHER): Payer: BLUE CROSS/BLUE SHIELD | Admitting: Obstetrics and Gynecology

## 2015-01-19 VITALS — BP 110/60 | HR 70 | Ht 59.0 in | Wt 119.6 lb

## 2015-01-19 DIAGNOSIS — N946 Dysmenorrhea, unspecified: Secondary | ICD-10-CM

## 2015-01-19 DIAGNOSIS — Z3169 Encounter for other general counseling and advice on procreation: Secondary | ICD-10-CM

## 2015-01-19 NOTE — Progress Notes (Signed)
Patient ID: Sheri Cameron, female   DOB: February 11, 1981, 34 y.o.   MRN: [id number] GYNECOLOGY  VISIT   HPI: 34 y.o.   Married  Caucasian  female   [id number] with Patient's last menstrual period was 01/11/2015 (exact date).   here for follow-up.  Had pain and bleeding from 01/12/15 - 01/16/15.  Took Tramadol, last dose on 01/16/15. Feeling much better today.   Unable to work on the Feb. 11 and 12 due to pain.  Needs a note for work.   Looking forward to future pregnancy.  History of 2 prior miscarriages prior to metroplasty. Is status post hysteroscopic metroplasty and laparoscopic fulguration of endometriosis in November 2014.  Normal HSG following procedure.  Has almost completely weaned off her Zoloft.  Is taking PNV. Wants to start exercising.   GYNECOLOGIC HISTORY: Patient's last menstrual period was 01/11/2015 (exact date). Contraception:  Nexplanon  Menopausal hormone therapy: n/a        OB History    Gravida Para Term Preterm AB TAB SAB Ectopic Multiple Living   Patient Active Problem List   Diagnosis Date Noted  . Pelvic pain in female 04/13/2014  . Endometriosis 04/13/2014  . Acute right lower quadrant pain 04/13/2014    Past Medical History  Diagnosis Date  . Anxiety   . Anemia   . Depression   . Dyspareunia   . Gastric ulcer   . Dysmenorrhea   . Asthma     rarely and mild  . Headache(784.0)   . Endometriosis     Past Surgical History  Procedure Laterality Date  . Appendectomy    . Cholecystectomy    . Dilatation & currettage/hysteroscopy with resectocope N/A 10/06/2013    Procedure: HYSTEROSCOPY WITH RESECTION OF UTERINE SEPTUM;  Surgeon: Melony Overly, MD;  Location: [facility];  Service: Gynecology;  Laterality: N/A;  resection of uterine septum.   . Laparoscopy N/A 10/06/2013    Procedure: LAPAROSCOPY DIAGNOSTIC, FULGERATION OF ENDOMETRIOSIS;  Surgeon: Melony Overly, MD;  Location: [facility];  Service: Gynecology;  Laterality: N/A;  .  Cervical polypectomy N/A 10/06/2013    Procedure: CERVICAL POLYPECTOMY;  Surgeon: Melony Overly, MD;  Location: [facility];  Service: Gynecology;  Laterality: N/A;  . Nexplanon insertion Left 11/23/13    Current Outpatient Prescriptions  Medication Sig Dispense Refill  . etonogestrel (NEXPLANON) 68 MG IMPL implant Inject 1 each into the skin once.    . sertraline (ZOLOFT) 25 MG tablet Take 50 mg by mouth at bedtime. Take 2 tabs daily    . traMADol (ULTRAM) 50 MG tablet Take 1 tablet (50 mg total) by mouth every 6 (six) hours as needed. 15 tablet 0   No current facility-administered medications for this visit.     ALLERGIES: Augmentin and Erythromycin  Family History  Problem Relation Age of Onset  . Cancer Father     cholangiocarcinoma  . Breast cancer Paternal Grandmother   . Hypertension Paternal Grandmother   . Diabetes Paternal Grandmother   . Cancer Paternal Grandfather     cholangiocarcinoma/liver ca  . Cancer Maternal Grandfather     pancreatic cancer  . Ovarian cancer Maternal Aunt   . Hypertension Maternal Grandmother   . Diabetes Maternal Grandmother     History   Social History  . Marital Status: Married    Spouse Name: N/A  . Number of Children: N/A  .  Years of Education: N/A   Occupational History  . Not on file.   Social History Main Topics  . Smoking status: Never Smoker   . Smokeless tobacco: Never Used  . Alcohol Use: No  . Drug Use: No  . Sexual Activity:    Partners: Male     Comment: Nexplanon inserted 11-23-13   Other Topics Concern  . Not on file   Social History Narrative    ROS:  Pertinent items are noted in HPI.  PHYSICAL EXAMINATION:    BP 110/60 mmHg  Pulse 70  Ht 4\' 11"  (1.499 m)  Wt 119 lb 9.6 oz (54.25 kg)  BMI 24.14 kg/m2  LMP 01/11/2015 (Exact Date)     General appearance: alert, cooperative and appears stated age.  Looks happy.   Pelvic: External genitalia:  no lesions              Urethra:  normal appearing urethra  with no masses, tenderness or lesions              Bartholins and Skenes: normal                 Vagina: normal appearing vagina with normal color and discharge, no lesions              Cervix: normal appearance                   Bimanual Exam:  Uterus:  uterus is normal size, shape, consistency and nontender                                      Adnexa: normal adnexa in size, nontender and no masses  ASSESSMENT  Dysmenorrhea resolved. Nexplanon patient.  History of recurrent miscarriage.  Status post hysteroscopic metroplasty.  Endometrial cavity unified on hysterosalpingogram.  Desire for future pregnancy.   PLAN  Preconception counseling given.  Nutrition, exercise, avoidance of exposures including medications, early prenatal care and ultrasound.  Discussed obstetric care from local colleagues in addition to potential MFM consultation if needed. Note for absence from work.  Return prn.   An After Visit Summary was printed and given to the patient.  __25____ minutes face to face time of which over 50% was spent in counseling.

## 2015-02-24 ENCOUNTER — Ambulatory Visit (INDEPENDENT_AMBULATORY_CARE_PROVIDER_SITE_OTHER): Payer: BLUE CROSS/BLUE SHIELD | Admitting: Neurology

## 2015-02-24 ENCOUNTER — Encounter: Payer: Self-pay | Admitting: Neurology

## 2015-02-24 VITALS — BP 108/68 | HR 76 | Ht 59.0 in | Wt 116.0 lb

## 2015-02-24 DIAGNOSIS — R292 Abnormal reflex: Secondary | ICD-10-CM | POA: Diagnosis not present

## 2015-02-24 DIAGNOSIS — R251 Tremor, unspecified: Secondary | ICD-10-CM | POA: Diagnosis not present

## 2015-02-24 NOTE — Patient Instructions (Signed)
1. [facility] 03/08/15 at 12:00pm. Please arrive 15 minutes prior and go to 1st floor radiology. If you need to reschedule for any reason please call [phone].

## 2015-02-24 NOTE — Progress Notes (Signed)
Note faxed to Dr Elesa MassedWard at [phone] with confirmation received.

## 2015-02-24 NOTE — Progress Notes (Signed)
Subjective:   Sheri Cameron was seen in consultation in the movement disorder clinic at the request of the patients chiropractor, Dr. Elesa MassedWard.  The evaluation is for tremor.  Her mother accompanies her to the appt and supplements the history.    The patient is a 34 y.o. right handed female with a history of tremor.  She doesn't know how long it has been going on; states that Dr. Elesa MassedWard noticed it and she never did.  She is right hand dominant.  She notices that her right foot wants to stick to the floor over the last month.  She has had some balance issues and falls over the last month.  This week she had tremor of both legs while standing and then she fell.  No new medications.  There is no significant family hx of tremor (her mother states that her father in law had tremor but was due to CA).    Affected by caffeine:  No. (2-3 glasses sweet tea) Affected by alcohol:  Doesn't drink Affected by stress:  No. Affected by fatigue:  unknown Spills soup if on spoon:  No. Spills glass of liquid if full:  No. Affects ADL's (tying shoes, brushing teeth, etc):  No.   Current/Previously tried tremor medications: n/a  Current medications that may exacerbate tremor:  n/a  Outside reports reviewed: lab reports and office notes.  Allergies  Allergen Reactions  . Augmentin [Amoxicillin-Pot Clavulanate] Hives  . Erythromycin Hives    Outpatient Encounter Prescriptions as of 02/24/2015  Medication Sig  . cholecalciferol (VITAMIN D) 1000 UNITS tablet Take 1,000 Units by mouth daily.  Marland Kitchen. etonogestrel (NEXPLANON) 68 MG IMPL implant Inject 1 each into the skin once.  Marland Kitchen. OVER THE COUNTER MEDICATION Introvite, XCL, Repairvite, dreamin (vitamins)  . sertraline (ZOLOFT) 25 MG tablet Take 50 mg by mouth at bedtime. Take 2 tabs daily  . traMADol (ULTRAM) 50 MG tablet Take 1 tablet (50 mg total) by mouth every 6 (six) hours as needed.  . [DISCONTINUED] sertraline (ZOLOFT) 100 MG tablet Take 100 mg by mouth daily.      Past Medical History  Diagnosis Date  . Anxiety   . Anemia   . Depression   . Dyspareunia   . Gastric ulcer   . Dysmenorrhea   . Asthma     rarely and mild  . Headache(784.0)   . Endometriosis     Past Surgical History  Procedure Laterality Date  . Appendectomy    . Cholecystectomy    . Dilatation & currettage/hysteroscopy with resectocope N/A 10/06/2013    Procedure: HYSTEROSCOPY WITH RESECTION OF UTERINE SEPTUM;  Surgeon: Melony OverlyBrook A Silva, MD;  Location: [facility];  Service: Gynecology;  Laterality: N/A;  resection of uterine septum.   . Laparoscopy N/A 10/06/2013    Procedure: LAPAROSCOPY DIAGNOSTIC, FULGERATION OF ENDOMETRIOSIS;  Surgeon: Melony OverlyBrook A Silva, MD;  Location: [facility];  Service: Gynecology;  Laterality: N/A;  . Cervical polypectomy N/A 10/06/2013    Procedure: CERVICAL POLYPECTOMY;  Surgeon: Melony OverlyBrook A Silva, MD;  Location: [facility];  Service: Gynecology;  Laterality: N/A;  . Nexplanon insertion Left 11/23/13    History   Social History  . Marital Status: Married    Spouse Name: N/A  . Number of Children: N/A  . Years of Education: N/A   Occupational History  . Not on file.   Social History Main Topics  . Smoking status: Never Smoker   . Smokeless tobacco: Never Used  . Alcohol Use: No  .  Drug Use: No  . Sexual Activity:    Partners: Male     Comment: Nexplanon inserted 11-23-13   Other Topics Concern  . Not on file   Social History Narrative    Family Status  Relation Status Death Age  . Father Deceased     cholangiocarcinoma  . Paternal Grandmother Deceased   . Maternal Grandmother Deceased   . Mother Alive     healthy  . Sister Alive     healthy    Review of Systems No neck pain.  A complete 10 system ROS was obtained and was negative apart from what is mentioned.   Objective:   VITALS:   Filed Vitals:   02/24/15 0817  BP: 108/68  Pulse: 76  Height:  (1.499 m)  Weight: 116 lb (52.617 kg)   Gen:  Appears stated age and in NAD.   She is anxious appearing. HEENT:  Normocephalic, atraumatic. The mucous membranes are moist. The superficial temporal arteries are without ropiness or tenderness. Cardiovascular: Regular rate and rhythm. Lungs: Clear to auscultation bilaterally. Neck: There are no carotid bruits noted bilaterally.  NEUROLOGICAL:  Orientation:  The patient is alert and oriented x 3.  Recent and remote memory are intact.  Attention span and concentration are normal.  Able to name objects and repeat without trouble.  Fund of knowledge is appropriate Cranial nerves: There is good facial symmetry. The pupils are equal round and reactive to light bilaterally. Fundoscopic exam reveals clear disc margins bilaterally. Extraocular muscles are intact and visual fields are full to confrontational testing. Speech is fluent and clear. Soft palate rises symmetrically and there is no tongue deviation. Hearing is intact to conversational tone. Tone: Tone is good throughout. Sensation: The patient reports decreased light touch, pinprick, temperature, and vibration in the entire right hemisoma compared to the left.  There is vibrational splitting, and the patient states that she does not feel the vibration on the right side of the chest and feels that on the left.  The same is true for the forehead. Coordination:  The patient has no dysdiadichokinesia or dysmetria. Motor: Strength is 5/5 in the bilateral upper and lower extremities.  Shoulder shrug is equal bilaterally.  There is no pronator drift.  There are no fasciculations noted. DTR's: Deep tendon reflexes are 3/4 at the bilateral biceps, triceps, brachioradialis, patella and achilles.  There are pectoralis reflexes bilaterally.  No hoffmans or tromners reflexes.   Plantar responses are downgoing bilaterally. Gait and Station: The patient is slow and tenuous with ambulation.  At the end of the hallway when asked to turn around, the patient stands still and states that her feet are  stuck and she feels dizzy and cannot move.  After about 20 seconds, she slowly turns background and tenuously begins to move with an astasia abasia quality.  She is able to ambulate in a tandem fashion, but states "I feel like I am going to fall" but is able to continue to walk a straight line.  She stands in the Romberg position with eyes open and with eyes closed for about 30 seconds and then begins to sway and will start to lean to one side and then opens her eyes and takes a step to the side.  MOVEMENT EXAM: Tremor:  There is initially tremor in the right arm at rest when I walked into the room.  During the lengthy history taking, the tremor seemed to abate.  When the patient was asked to tap  a rhythm, the tremor would take on the same pattern.  With distraction procedures, the tremor would go away.  Labs:  The patient had lab work done by her chiropractor on 02/05/2015.  I appreciate receiving as stated.  Her hemoglobin A1c was 5.4, AST was 14, ALT 17, alkaline phosphatase was 77.  Her TSH was 1.920.  Her white blood cells were 7.1, hemoglobin 14.1, hematocrit 41.6 and platelets were 404.     Assessment/Plan:   1.  Tremor.  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  There were multiple nonphysiologic aspects to the examination including a nonphysiologic pattern to the tremor, vibrational splitting, and astasia abasia quality to the gait.   I reassured the patient and her mother that this is not Parkinson's disease, which they were worried about.  This is also not essential tremor.  Much greater than 50% of the 60 min visit was spent in counseling.  I talked to them about psychogenic tremor and she already has a psychiatrist, Dr. Evelene Croon, and I think it would be wise of her to involve her to see if they can get to the etiology of the stress source that is likely causing some of the symptoms.  Would also be of value for her to get a PCP. 2.  Hyperreflexia  -This may be physiologic hyperreflexia in a  young woman, which is fairly common, but given her complaints I think we should do an MRI of the brain just to rule out demyelinating disease.  Her history today is not consistent with demyelinating disease, but I told her we probably should do this to make sure we are not missing anything.  They were agreeable.  If that is negative, then she does not necessarily need to follow-up here unless new or different/progressive symptoms arise.  We will call her with the results if normal but if not, will see her back in the office.  Thank you for allowing me to participate in the care of your patient.  Feel free to contact me with questions or concerns.

## 2015-03-08 ENCOUNTER — Ambulatory Visit (HOSPITAL_COMMUNITY)
Admission: RE | Admit: 2015-03-08 | Discharge: 2015-03-08 | Disposition: A | Discharge status: Home / Self Care | Payer: BLUE CROSS/BLUE SHIELD | Source: Ambulatory Visit | Attending: Neurology | Admitting: Neurology

## 2015-03-08 DIAGNOSIS — R251 Tremor, unspecified: Secondary | ICD-10-CM | POA: Diagnosis not present

## 2015-03-08 DIAGNOSIS — R292 Abnormal reflex: Secondary | ICD-10-CM | POA: Diagnosis not present

## 2015-03-09 ENCOUNTER — Telehealth: Payer: Self-pay | Admitting: Neurology

## 2015-03-09 NOTE — Telephone Encounter (Signed)
-----   Message from Octaviano Battyebecca S Tat, DO sent at 03/08/2015  4:24 PM EDT ----- Please let the patient know that the MRI of the brain was normal per radiologist and myself

## 2015-03-09 NOTE — Telephone Encounter (Signed)
Patient made aware MR brain normal.

## 2015-04-14 ENCOUNTER — Telehealth: Payer: Self-pay | Admitting: Obstetrics and Gynecology

## 2015-04-14 NOTE — Telephone Encounter (Signed)
Left patient a message to call back when ready to reschedule, canceled by automated reminder call. °

## 2015-04-18 ENCOUNTER — Ambulatory Visit: Payer: BC Managed Care – PPO | Admitting: Obstetrics and Gynecology

## 2015-09-22 ENCOUNTER — Emergency Department (HOSPITAL_COMMUNITY): Payer: BLUE CROSS/BLUE SHIELD

## 2015-09-22 ENCOUNTER — Encounter (HOSPITAL_COMMUNITY): Payer: Self-pay

## 2015-09-22 ENCOUNTER — Emergency Department (HOSPITAL_COMMUNITY)
Admission: EM | Admit: 2015-09-22 | Discharge: 2015-09-23 | Disposition: A | Discharge status: Home / Self Care | Payer: BLUE CROSS/BLUE SHIELD | Attending: Emergency Medicine | Admitting: Emergency Medicine

## 2015-09-22 DIAGNOSIS — F419 Anxiety disorder, unspecified: Secondary | ICD-10-CM | POA: Diagnosis not present

## 2015-09-22 DIAGNOSIS — M549 Dorsalgia, unspecified: Secondary | ICD-10-CM

## 2015-09-22 DIAGNOSIS — M545 Low back pain: Secondary | ICD-10-CM | POA: Diagnosis not present

## 2015-09-22 DIAGNOSIS — F329 Major depressive disorder, single episode, unspecified: Secondary | ICD-10-CM | POA: Diagnosis not present

## 2015-09-22 DIAGNOSIS — Z8742 Personal history of other diseases of the female genital tract: Secondary | ICD-10-CM | POA: Diagnosis not present

## 2015-09-22 DIAGNOSIS — Z8719 Personal history of other diseases of the digestive system: Secondary | ICD-10-CM | POA: Diagnosis not present

## 2015-09-22 DIAGNOSIS — Z862 Personal history of diseases of the blood and blood-forming organs and certain disorders involving the immune mechanism: Secondary | ICD-10-CM | POA: Diagnosis not present

## 2015-09-22 DIAGNOSIS — J45909 Unspecified asthma, uncomplicated: Secondary | ICD-10-CM | POA: Diagnosis not present

## 2015-09-22 DIAGNOSIS — Z79899 Other long term (current) drug therapy: Secondary | ICD-10-CM | POA: Diagnosis not present

## 2015-09-22 LAB — CBC WITH DIFFERENTIAL/PLATELET
BASOS ABS: 0.1 10*3/uL (ref 0.0–0.1)
Basophils Relative: 1 %
EOS ABS: 0.3 10*3/uL (ref 0.0–0.7)
Eosinophils Relative: 2 %
HCT: 40.7 % (ref 36.0–46.0)
Hemoglobin: 13.6 g/dL (ref 12.0–15.0)
LYMPHS ABS: 4.3 10*3/uL — AB (ref 0.7–4.0)
Lymphocytes Relative: 32 %
MCH: 28.8 pg (ref 26.0–34.0)
MCHC: 33.4 g/dL (ref 30.0–36.0)
MCV: 86 fL (ref 78.0–100.0)
Monocytes Absolute: 1.2 10*3/uL — ABNORMAL HIGH (ref 0.1–1.0)
Monocytes Relative: 9 %
NEUTROS PCT: 56 %
Neutro Abs: 7.7 10*3/uL (ref 1.7–7.7)
PLATELETS: 365 10*3/uL (ref 150–400)
RBC: 4.73 MIL/uL (ref 3.87–5.11)
RDW: 13.1 % (ref 11.5–15.5)
WBC: 13.5 10*3/uL — AB (ref 4.0–10.5)

## 2015-09-22 LAB — I-STAT BETA HCG BLOOD, ED (MC, WL, AP ONLY): I-stat hCG, quantitative: <5 m[IU]/mL (ref <5)

## 2015-09-22 LAB — BASIC METABOLIC PANEL
Anion gap: 7 (ref 5–15)
BUN: 8 mg/dL (ref 6–20)
CO2: 27 mmol/L (ref 22–32)
Calcium: 9.1 mg/dL (ref 8.9–10.3)
Chloride: 105 mmol/L (ref 101–111)
Creatinine, Ser: 0.73 mg/dL (ref 0.44–1.00)
GFR calc Af Amer: >60 mL/min (ref >60)
GLUCOSE: 106 mg/dL — AB (ref 65–99)
Potassium: 3.6 mmol/L (ref 3.5–5.1)
Sodium: 139 mmol/L (ref 135–145)

## 2015-09-22 MED ORDER — HYDROMORPHONE HCL 1 MG/ML IJ SOLN
1.0000 mg | Freq: Once | INTRAMUSCULAR | Status: AC
  Administered 2015-09-23: 1 mg via INTRAVENOUS
  Filled 2015-09-22: qty 1

## 2015-09-22 MED ORDER — METHOCARBAMOL 1000 MG/10ML IJ SOLN
1000.0000 mg | Freq: Once | INTRAVENOUS | Status: AC
  Administered 2015-09-22: 1000 mg via INTRAVENOUS
  Filled 2015-09-22: qty 10

## 2015-09-22 MED ORDER — HYDROMORPHONE HCL 1 MG/ML IJ SOLN: 1.0000 mg | Freq: Once | INTRAMUSCULAR | Status: DC

## 2015-09-22 MED ORDER — HYDROMORPHONE HCL 1 MG/ML IJ SOLN: 0.5000 mg | Freq: Once | INTRAMUSCULAR | Status: DC

## 2015-09-22 MED ORDER — KETOROLAC TROMETHAMINE 30 MG/ML IJ SOLN
30.0000 mg | Freq: Once | INTRAMUSCULAR | Status: AC
  Administered 2015-09-22: 30 mg via INTRAMUSCULAR
  Filled 2015-09-22: qty 1

## 2015-09-22 MED ORDER — METHOCARBAMOL 1000 MG/10ML IJ SOLN
1000.0000 mg | Freq: Once | INTRAMUSCULAR | Status: DC
  Filled 2015-09-22: qty 10

## 2015-09-22 MED ORDER — DEXAMETHASONE SODIUM PHOSPHATE 10 MG/ML IJ SOLN
10.0000 mg | Freq: Once | INTRAMUSCULAR | Status: AC
  Administered 2015-09-23: 10 mg via INTRAVENOUS
  Filled 2015-09-22: qty 1

## 2015-09-22 MED ORDER — ONDANSETRON 8 MG PO TBDP
8.0000 mg | ORAL_TABLET | Freq: Once | ORAL | Status: AC
Start: 2015-09-22 — End: 2015-09-22
  Administered 2015-09-22: 8 mg via ORAL
  Filled 2015-09-22: qty 1

## 2015-09-22 MED ORDER — METHOCARBAMOL 1000 MG/10ML IJ SOLN: 1000.0000 mg | Freq: Once | INTRAMUSCULAR | Status: DC

## 2015-09-22 MED ORDER — FENTANYL CITRATE (PF) 100 MCG/2ML IJ SOLN: 50.0000 ug | Freq: Once | INTRAMUSCULAR | Status: DC

## 2015-09-22 MED ORDER — DIAZEPAM 5 MG/ML IJ SOLN
5.0000 mg | Freq: Once | INTRAMUSCULAR | Status: AC
  Administered 2015-09-22: 5 mg via INTRAVENOUS
  Filled 2015-09-22: qty 2

## 2015-09-22 MED ORDER — HYDROMORPHONE HCL 1 MG/ML IJ SOLN
1.0000 mg | Freq: Once | INTRAMUSCULAR | Status: AC
  Administered 2015-09-22: 1 mg via INTRAVENOUS
  Filled 2015-09-22: qty 1

## 2015-09-22 MED ORDER — HYDROMORPHONE HCL 1 MG/ML IJ SOLN
0.5000 mg | Freq: Once | INTRAMUSCULAR | Status: AC
  Administered 2015-09-22: 0.5 mg via INTRAVENOUS
  Filled 2015-09-22: qty 1
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
## 2015-09-22 NOTE — ED Notes (Addendum)
Pt called out asking for pain medication for back spasm.  Made PA aware. No new orders at this time.

## 2015-09-22 NOTE — ED Notes (Signed)
Delay in lab draw, pt in MRI 

## 2015-09-22 NOTE — ED Notes (Signed)
PA at bedside.
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
## 2015-09-23 MED ORDER — MELOXICAM 7.5 MG PO TABS: 15.0000 mg | ORAL_TABLET | Freq: Every day | ORAL | Status: DC

## 2015-09-23 MED ORDER — KETOROLAC TROMETHAMINE 30 MG/ML IJ SOLN
30.0000 mg | Freq: Once | INTRAMUSCULAR | Status: AC
  Administered 2015-09-23: 30 mg via INTRAVENOUS
  Filled 2015-09-23: qty 1

## 2015-09-23 MED ORDER — OXYCODONE-ACETAMINOPHEN 5-325 MG PO TABS: 1.0000 | ORAL_TABLET | Freq: Four times a day (QID) | ORAL | Status: DC | PRN

## 2015-09-23 MED ORDER — METHOCARBAMOL 500 MG PO TABS: 500.0000 mg | ORAL_TABLET | Freq: Two times a day (BID) | ORAL | Status: DC

## 2015-09-23 MED ORDER — PREDNISONE 20 MG PO TABS: 40.0000 mg | ORAL_TABLET | Freq: Every day | ORAL | Status: DC
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
## 2015-09-23 NOTE — Discharge Instructions (Signed)
Alternate ice and heat to your back 3-4 times per day for 15-20 minutes each time. Take prednisone and meloxicam as prescribed for symptoms. Take Robaxin for muscle spasms and Percocet as needed for severe pain. Follow-up with a primary care doctor for further evaluation of your symptoms. Return to the emergency department as needed if symptoms worsen.  Back Pain, Adult Back pain is very common in adults.The cause of back pain is rarely dangerous and the pain often gets better over time.The cause of your back pain may not be known. Some common causes of back pain include:  Strain of the muscles or ligaments supporting the spine.  Wear and tear (degeneration) of the spinal disks.  Arthritis.  Direct injury to the back. For many people, back pain may return. Since back pain is rarely dangerous, most people can learn to manage this condition on their own. HOME CARE INSTRUCTIONS Watch your back pain for any changes. The following actions may help to lessen any discomfort you are feeling:  Remain active. It is stressful on your back to sit or stand in one place for long periods of time. Do not sit, drive, or stand in one place for more than 30 minutes at a time. Take short walks on even surfaces as soon as you are able.Try to increase the length of time you walk each day.  Exercise regularly as directed by your health care provider. Exercise helps your back heal faster. It also helps avoid future injury by keeping your muscles strong and flexible.  Do not stay in bed.Resting more than 1-2 days can delay your recovery.  Pay attention to your body when you bend and lift. The most comfortable positions are those that put less stress on your recovering back. Always use proper lifting techniques, including:  Bending your knees.  Keeping the load close to your body.  Avoiding twisting.  Find a comfortable position to sleep. Use a firm mattress and lie on your side with your knees slightly  bent. If you lie on your back, put a pillow under your knees.  Avoid feeling anxious or stressed.Stress increases muscle tension and can worsen back pain.It is important to recognize when you are anxious or stressed and learn ways to manage it, such as with exercise.  Take medicines only as directed by your health care provider. Over-the-counter medicines to reduce pain and inflammation are often the most helpful.Your health care provider may prescribe muscle relaxant drugs.These medicines help dull your pain so you can more quickly return to your normal activities and healthy exercise.  Apply ice to the injured area:  Put ice in a plastic bag.  Place a towel between your skin and the bag.  Leave the ice on for 20 minutes, 2-3 times a day for the first 2-3 days. After that, ice and heat may be alternated to reduce pain and spasms.  Maintain a healthy weight. Excess weight puts extra stress on your back and makes it difficult to maintain good posture. SEEK MEDICAL CARE IF:  You have pain that is not relieved with rest or medicine.  You have increasing pain going down into the legs or buttocks.  You have pain that does not improve in one week.  You have night pain.  You lose weight.  You have a fever or chills. SEEK IMMEDIATE MEDICAL CARE IF:   You develop new bowel or bladder control problems.  You have unusual weakness or numbness in your arms or legs.  You develop nausea  or vomiting.  You develop abdominal pain.  You feel faint.   This information is not intended to replace advice given to you by your health care provider. Make sure you discuss any questions you have with your health care provider.   Document Released: 11/19/2005 Document Revised: 12/10/2014 Document Reviewed: 03/23/2014 Elsevier Interactive Patient Education 2016 [employer].   Emergency Department Resource Guide 1) Find a Doctor and Pay Out of Pocket Although you won't have to find out who is  covered by your insurance plan, it is a good idea to ask around and get recommendations. You will then need to call the office and see if the doctor you have chosen will accept you as a new patient and what types of options they offer for patients who are self-pay. Some doctors offer discounts or will set up payment plans for their patients who do not have insurance, but you will need to ask so you aren't surprised when you get to your appointment.  2) Contact Your Local Health Department Not all health departments have doctors that can see patients for sick visits, but many do, so it is worth a call to see if yours does. If you don't know where your local health department is, you can check in your phone book. The CDC also has a tool to help you locate your state's health department, and many state websites also have listings of all of their local health departments.  3) Find a Walk-in Clinic If your illness is not likely to be very severe or complicated, you may want to try a walk in clinic. These are popping up all over the country in pharmacies, drugstores, and shopping centers. They're usually staffed by nurse practitioners or physician assistants that have been trained to treat common illnesses and complaints. They're usually fairly quick and inexpensive. However, if you have serious medical issues or chronic medical problems, these are probably not your best option.  No Primary Care Doctor: - Call Health Connect at  [phone] - they can help you locate a primary care doctor that  accepts your insurance, provides certain services, etc. - Physician Referral Service- [phone]  Chronic Pain Problems: Organization         Address  Phone   Notes  [facility]  [phone] Patients need to be referred by their primary care doctor.   Medication Assistance: Organization         Address  Phone   Notes  [facility] Medication [facility] [address] [place] [place], [place] [address] [phone] --Must be a resident of [facility] -- Must have NO insurance coverage whatsoever (no Medicaid/ Medicare, etc.) -- The pt. MUST have a primary care doctor that directs their care regularly and follows them in the community   Med[facility]ist  [phone]   [employer]  [phone]    Agencies that provide inexpensive medical care: Organization         Address  Phone   Notes  [facility]  [phone]   [facility]    [phone]   [facility] [address] [place], [place] [address] [phone]   [facility] [address] [place] [place] [phone]   Planned Parenthood    [phone]   [facility]    [phone]   [facility]  [address] [place], [employer] Phone:  [phone], Fax:  [phone] Hours of Operation:  9 am - 6 pm, M-F.  Also accepts Medicaid/Medicare and self-pay.  [facility] for [place], [address], [place] Phone: [phone], Fax: [phone]. Hours of Operation:  8:30 am - 5:30 pm, M-F.  Also accepts Medicaid and self-pay.  [facility] [place] [address] [place] Phone: [phone]   [place], [place], [place] [phone], Ext. [phone] Mondays & Thursdays: 7-9 AM.  First 15 patients are seen on a first come, first serve basis.    [place] Providers:  Organization         Address  Phone   Notes  [facility] [address], [place] [phone] Also accepts self-pay patients.  [facility] [id number] [place], [place]  [phone]   [place], [address], [place] [phone]   [facility] [address], [place] [phone]   Lucianne Lei [address], [place]   [phone] Only accepts [place] Access [place] patients after they have their name applied to their card.   Self-Pay (no insurance) in [facility]:  Organization         Address  Phone   Notes  Sickle Cell Patients, [facility] Internal Medicine [place] [phone]   [facility] [place] [phone]   [facility] [place]  [place], [place],  [phone]   [facility]/Dr. Osei-Bonsu  [address], [place] or [address], [place] [phone] Phone number for both [place] and [place] locations is the same.  [facility] [address], [place] [phone]   [facility] [address] [place] or [address] [phone]   [facility] [address] [place] [phone], phone; [phone], fax Sees patients 1st and 3rd Saturday of every month.  Must not qualify for public or private insurance (i.e. Medicaid, Medicare, [place] Health Choice, Veterans' Benefits)  Household income should be no more than 200% of the poverty level The clinic cannot treat you if you are pregnant or think you are pregnant  Sexually transmitted diseases are not treated at the clinic.    Dental Care: Organization         Address  Phone  Notes  [facility] of [facility] [place] [phone] Accepts children up to age 28 who are enrolled in [place] or [place]; pregnant women with a Medicaid card; and children who have applied for Medicaid or [place] Health Choice, but were declined, whose parents can pay a reduced fee at time of service.  [facility] Department of [facility]  [address], [place] [phone] Accepts children up to age 11 who are enrolled  in [place] or [place]; pregnant women with a Medicaid card; and children who have applied for Medicaid or  Health Choice, but were declined, whose parents can pay a reduced fee at time of service.  [place] Adult [facility] Access PROGRAM  [place] [phone] Patients are seen by appointment only. Walk-ins  are not accepted. [place] will see patients 76 years of age and older. Monday - Tuesday (8am-5pm) Most Wednesdays (8:30-5pm) $30 per visit, cash only  [facility] Adult Dental Access PROGRAM  [address], [facility] [phone] Patients are seen by appointment only. Walk-ins are not accepted. [place] will see patients 16 years of age and older. One Wednesday Evening (Monthly: Volunteer Based).  $30 per visit, cash only  [place]  [phone] for adults; Children under age 71, call Graduate Pediatric Dentistry at [phone]. Children aged 4-14, please call [phone] to request a pediatric application.  Dental services are provided in all areas of dental care including fillings, crowns and bridges, complete and partial dentures, implants, gum treatment, root canals, and extractions. Preventive care is also provided. Treatment is provided to both adults and children. Patients are selected via a lottery and there is often a waiting list.   [facility] [address], [place]  [phone] [email or web address]   [facility] [address] [place], [place] [phone], Ext. [phone] Second and Fourth Thursday of each month, opens at 6:30 AM; Clinic ends at 9 AM.  Patients are seen on a first-come first-served basis, and a limited number are seen during each clinic.   [facility]  [address] Hillard Danker [place], [place] [phone]   Eligibility Requirements You must have lived in [place], [place], or [place] counties for at least the last three months.   You cannot be  eligible for state or federal sponsored [employer], including [employer], [place], or [employer].   You generally cannot be eligible for healthcare insurance through your employer.    How to apply: Eligibility screenings are held every Tuesday and Wednesday afternoon from 1:00 pm until 4:00 pm. You do not need an appointment for the interview!  [facility] [address] [place], [place]   [place]  [place] Department  [place]  [phone]    Behavioral Health Resources in the Community: Intensive Outpatient Programs Organization         Address  Phone  Notes  [place]. [address] [place], [place] [phone]   [facility] Outpatient [address] [place], [address] [place]   ADS: Alcohol [facility] Drug S[facility] [address], [place], [place]   [place] [address]  [place], [place] or [phone]   Substance Abuse Resources Organization         Address  Phone  Notes  Alcohol and Drug Services  [phone]   [place]  [phone]   [facility] [place]   Chinita Pester  [phone]   Residential & Outpatient Substance Abuse Program  6107718822   Psychological Services Organization         Address  Phone  Notes  [facility] [place]  [place]  [phone]   [place] [address], [place] [phone] or [phone]    Mobile Crisis Teams Organization         Address  Phone  Notes  [facility], Mobile Crisis Care Unit  [phone]   [facility]  [address] [place], [place]   [facility] [address] [place] [employer] [place] [phone]    Self-Help/Support  Groups Organization          Address  Phone             Notes  [facility] [place] - variety of support groups  [place] Call for more information  Narcotics Anonymous (NA), [facility] [address], [employer]   2 meetings at this location   Special educational needs teacher         Address  Phone  Notes  [facility]idential [facility] [place],    Magazine  [phone]   [facility]  [address] [place] [id number], [place], [place]   [place], [place] [phone] Admissions: 8am-3pm M-F  Incentives Substance [place] [address]    [place], [place] [phone]   [facility] [address] [place], [place], [place]   [facility] [address]  [place], [place]   [facility] - Intensive Outpatient [address] Kristeen [address], [place], [place]   [facility] ([place].) [place].,  [place], [place] [phone] or [phone]   [facility] (RTS) [address], [place], [place] Accepts Medicaid  [facility] [place] [address]  [place] 1-7242299750 Substance Abuse/Addiction Treatment   [facility] Organization         Address  Phone  Notes  [facility]  [phone]   Domenic Schwab, PhD [address] Arlis Porta [place], [place]   [phone] or [phone]   [place], [place] [phone]   [facility] [address] [place], [place] [phone] Insurance/Medicaid/sponsorship through [facility] and Families [address], [address] [place]                                    [place], [place] [phone] [place] [address], [place] [phone]    Dr. Adele Schilder  [phone]   [facility] Dept. 1) [address] [place] 2) [place] 3)  [place] [address], [place] [phone]  [phone]   [place] [phone] or [phone] (After Hours)

## 2015-09-30 ENCOUNTER — Inpatient Hospital Stay (HOSPITAL_COMMUNITY)
Admission: AD | Admit: 2015-09-30 | Discharge: 2015-10-06 | DRG: 880 | Disposition: A | Discharge status: Home / Self Care | Payer: BLUE CROSS/BLUE SHIELD | Source: Ambulatory Visit | Attending: Internal Medicine | Admitting: Internal Medicine

## 2015-09-30 ENCOUNTER — Encounter (HOSPITAL_COMMUNITY): Payer: Self-pay | Admitting: General Practice

## 2015-09-30 DIAGNOSIS — F445 Conversion disorder with seizures or convulsions: Secondary | ICD-10-CM | POA: Insufficient documentation

## 2015-09-30 DIAGNOSIS — R569 Unspecified convulsions: Secondary | ICD-10-CM | POA: Diagnosis present

## 2015-09-30 DIAGNOSIS — K5900 Constipation, unspecified: Secondary | ICD-10-CM | POA: Diagnosis present

## 2015-09-30 DIAGNOSIS — D72829 Elevated white blood cell count, unspecified: Secondary | ICD-10-CM | POA: Diagnosis present

## 2015-09-30 DIAGNOSIS — Z791 Long term (current) use of non-steroidal anti-inflammatories (NSAID): Secondary | ICD-10-CM

## 2015-09-30 DIAGNOSIS — G4700 Insomnia, unspecified: Secondary | ICD-10-CM | POA: Diagnosis present

## 2015-09-30 DIAGNOSIS — E860 Dehydration: Secondary | ICD-10-CM | POA: Diagnosis present

## 2015-09-30 DIAGNOSIS — K5901 Slow transit constipation: Secondary | ICD-10-CM

## 2015-09-30 DIAGNOSIS — M545 Low back pain: Secondary | ICD-10-CM | POA: Diagnosis present

## 2015-09-30 DIAGNOSIS — F418 Other specified anxiety disorders: Secondary | ICD-10-CM | POA: Diagnosis present

## 2015-09-30 DIAGNOSIS — J45909 Unspecified asthma, uncomplicated: Secondary | ICD-10-CM | POA: Diagnosis present

## 2015-09-30 DIAGNOSIS — T380X5A Adverse effect of glucocorticoids and synthetic analogues, initial encounter: Secondary | ICD-10-CM | POA: Diagnosis present

## 2015-09-30 DIAGNOSIS — F319 Bipolar disorder, unspecified: Secondary | ICD-10-CM | POA: Diagnosis present

## 2015-09-30 DIAGNOSIS — R630 Anorexia: Secondary | ICD-10-CM | POA: Diagnosis present

## 2015-09-30 DIAGNOSIS — S14129A Central cord syndrome at unspecified level of cervical spinal cord, initial encounter: Secondary | ICD-10-CM

## 2015-09-30 DIAGNOSIS — F444 Conversion disorder with motor symptom or deficit: Secondary | ICD-10-CM | POA: Diagnosis present

## 2015-09-30 DIAGNOSIS — F449 Dissociative and conversion disorder, unspecified: Secondary | ICD-10-CM | POA: Diagnosis not present

## 2015-09-30 DIAGNOSIS — M549 Dorsalgia, unspecified: Secondary | ICD-10-CM | POA: Diagnosis not present

## 2015-09-30 DIAGNOSIS — R29818 Other symptoms and signs involving the nervous system: Secondary | ICD-10-CM | POA: Diagnosis not present

## 2015-09-30 DIAGNOSIS — R29898 Other symptoms and signs involving the musculoskeletal system: Secondary | ICD-10-CM | POA: Diagnosis not present

## 2015-09-30 DIAGNOSIS — Z6823 Body mass index (BMI) 23.0-23.9, adult: Secondary | ICD-10-CM

## 2015-09-30 DIAGNOSIS — R531 Weakness: Secondary | ICD-10-CM | POA: Diagnosis present

## 2015-09-30 DIAGNOSIS — R509 Fever, unspecified: Secondary | ICD-10-CM | POA: Diagnosis not present

## 2015-09-30 DIAGNOSIS — R202 Paresthesia of skin: Secondary | ICD-10-CM | POA: Diagnosis present

## 2015-09-30 HISTORY — DX: Unspecified Escherichia coli (E. coli) as the cause of diseases classified elsewhere: B96.20

## 2015-09-30 HISTORY — DX: Urinary tract infection, site not specified: N39.0

## 2015-09-30 LAB — SEDIMENTATION RATE: SED RATE: 5 mm/h (ref 0–22)

## 2015-09-30 LAB — RETICULOCYTES
RBC.: 5.08 MIL/uL (ref 3.87–5.11)
RETIC CT PCT: 1.5 % (ref 0.4–3.1)
Retic Count, Absolute: 76.2 10*3/uL (ref 19.0–186.0)

## 2015-09-30 LAB — RAPID URINE DRUG SCREEN, HOSP PERFORMED
Amphetamines: NOT DETECTED
Barbiturates: NOT DETECTED
Benzodiazepines: NOT DETECTED
Cocaine: NOT DETECTED
Opiates: NOT DETECTED
Tetrahydrocannabinol: NOT DETECTED

## 2015-09-30 LAB — COMPREHENSIVE METABOLIC PANEL
ALBUMIN: 4 g/dL (ref 3.5–5.0)
ALK PHOS: 63 U/L (ref 38–126)
ALT: 19 U/L (ref 14–54)
ANION GAP: 9 (ref 5–15)
AST: 17 U/L (ref 15–41)
BILIRUBIN TOTAL: 1.1 mg/dL (ref 0.3–1.2)
BUN: 10 mg/dL (ref 6–20)
CALCIUM: 9.3 mg/dL (ref 8.9–10.3)
CO2: 29 mmol/L (ref 22–32)
Chloride: 95 mmol/L — ABNORMAL LOW (ref 101–111)
Creatinine, Ser: 0.76 mg/dL (ref 0.44–1.00)
GFR calc non Af Amer: >60 mL/min (ref >60)
GLUCOSE: 96 mg/dL (ref 65–99)
POTASSIUM: 3.8 mmol/L (ref 3.5–5.1)
SODIUM: 133 mmol/L — AB (ref 135–145)
TOTAL PROTEIN: 6.7 g/dL (ref 6.5–8.1)

## 2015-09-30 LAB — IRON AND TIBC
Iron: 90 ug/dL (ref 28–170)
SATURATION RATIOS: 22 % (ref 10.4–31.8)
TIBC: 419 ug/dL (ref 250–450)
UIBC: 329 ug/dL

## 2015-09-30 LAB — URINALYSIS, ROUTINE W REFLEX MICROSCOPIC
BILIRUBIN URINE: NEGATIVE
Glucose, UA: NEGATIVE mg/dL
Hgb urine dipstick: NEGATIVE
KETONES UR: NEGATIVE mg/dL
LEUKOCYTES UA: NEGATIVE
NITRITE: NEGATIVE
PH: 8 (ref 5.0–8.0)
PROTEIN: NEGATIVE mg/dL
Specific Gravity, Urine: 1.008 (ref 1.005–1.030)
UROBILINOGEN UA: 1 mg/dL (ref 0.0–1.0)

## 2015-09-30 LAB — CK: CK TOTAL: 32 U/L — AB (ref 38–234)

## 2015-09-30 LAB — GLUCOSE, CAPILLARY: Glucose-Capillary: 153 mg/dL — ABNORMAL HIGH (ref 65–99)

## 2015-09-30 LAB — MAGNESIUM: MAGNESIUM: 2.2 mg/dL (ref 1.7–2.4)

## 2015-09-30 LAB — CBC WITH DIFFERENTIAL/PLATELET
BASOS PCT: 0 %
Basophils Absolute: 0 10*3/uL (ref 0.0–0.1)
EOS ABS: 0.2 10*3/uL (ref 0.0–0.7)
Eosinophils Relative: 1 %
HEMATOCRIT: 43.6 % (ref 36.0–46.0)
Hemoglobin: 14.3 g/dL (ref 12.0–15.0)
Lymphocytes Relative: 23 %
Lymphs Abs: 3.5 10*3/uL (ref 0.7–4.0)
MCH: 28.1 pg (ref 26.0–34.0)
MCHC: 32.8 g/dL (ref 30.0–36.0)
MCV: 85.8 fL (ref 78.0–100.0)
MONO ABS: 1.1 10*3/uL — AB (ref 0.1–1.0)
MONOS PCT: 7 %
Neutro Abs: 10.6 10*3/uL — ABNORMAL HIGH (ref 1.7–7.7)
Neutrophils Relative %: 69 %
Platelets: 394 10*3/uL (ref 150–400)
RBC: 5.08 MIL/uL (ref 3.87–5.11)
RDW: 13.2 % (ref 11.5–15.5)
WBC: 15.5 10*3/uL — ABNORMAL HIGH (ref 4.0–10.5)

## 2015-09-30 LAB — VITAMIN B12: Vitamin B-12: 314 pg/mL (ref 180–914)

## 2015-09-30 LAB — FERRITIN: Ferritin: 22 ng/mL (ref 11–307)

## 2015-09-30 LAB — TSH: TSH: 1.619 u[IU]/mL (ref 0.350–4.500)

## 2015-09-30 LAB — C-REACTIVE PROTEIN

## 2015-09-30 LAB — PHOSPHORUS: Phosphorus: 3.4 mg/dL (ref 2.5–4.6)

## 2015-09-30 LAB — FOLATE: Folate: 23.4 ng/mL (ref >5.9)

## 2015-09-30 MED ORDER — LORAZEPAM 2 MG/ML IJ SOLN
INTRAMUSCULAR | Status: AC
  Administered 2015-09-30: 1 mg via INTRAVENOUS
  Filled 2015-09-30: qty 1

## 2015-09-30 MED ORDER — TRAZODONE HCL 50 MG PO TABS: 150.0000 mg | ORAL_TABLET | Freq: Every day | ORAL | Status: DC

## 2015-09-30 MED ORDER — LORAZEPAM 2 MG/ML IJ SOLN
1.0000 mg | INTRAMUSCULAR | Status: DC | PRN
  Administered 2015-10-01 (×3): 1 mg via INTRAVENOUS
  Filled 2015-09-30 (×6): qty 1

## 2015-09-30 MED ORDER — LORAZEPAM 2 MG/ML IJ SOLN
1.0000 mg | Freq: Once | INTRAMUSCULAR | Status: AC
  Administered 2015-09-30: 1 mg via INTRAVENOUS

## 2015-09-30 MED ORDER — TRAZODONE HCL 100 MG PO TABS
100.0000 mg | ORAL_TABLET | Freq: Every day | ORAL | Status: DC
  Administered 2015-09-30 – 2015-10-03 (×4): 100 mg via ORAL
  Filled 2015-09-30 (×3): qty 1
  Filled 2015-09-30: qty 2

## 2015-09-30 MED ORDER — METHYLPREDNISOLONE SODIUM SUCC 125 MG IJ SOLR
60.0000 mg | Freq: Four times a day (QID) | INTRAMUSCULAR | Status: DC
  Administered 2015-09-30 – 2015-10-02 (×7): 60 mg via INTRAVENOUS
  Filled 2015-09-30 (×7): qty 2

## 2015-09-30 MED ORDER — ACETAMINOPHEN 650 MG RE SUPP: 650.0000 mg | Freq: Four times a day (QID) | RECTAL | Status: DC | PRN

## 2015-09-30 MED ORDER — HYDROCODONE-ACETAMINOPHEN 5-325 MG PO TABS
1.0000 | ORAL_TABLET | Freq: Four times a day (QID) | ORAL | Status: DC | PRN
  Administered 2015-10-04: 1 via ORAL
  Filled 2015-09-30 (×3): qty 1

## 2015-09-30 MED ORDER — ONDANSETRON HCL 4 MG/2ML IJ SOLN
4.0000 mg | Freq: Four times a day (QID) | INTRAMUSCULAR | Status: DC | PRN
  Administered 2015-09-30 – 2015-10-03 (×2): 4 mg via INTRAVENOUS
  Filled 2015-09-30: qty 2

## 2015-09-30 MED ORDER — ONDANSETRON HCL 4 MG PO TABS: 4.0000 mg | ORAL_TABLET | Freq: Four times a day (QID) | ORAL | Status: DC | PRN

## 2015-09-30 MED ORDER — ACETAMINOPHEN 325 MG PO TABS
650.0000 mg | ORAL_TABLET | Freq: Four times a day (QID) | ORAL | Status: DC | PRN
  Administered 2015-10-03 – 2015-10-06 (×3): 650 mg via ORAL
  Filled 2015-09-30 (×3): qty 2

## 2015-09-30 MED ORDER — IOHEXOL 300 MG/ML  SOLN: 25.0000 mL | Freq: Once | INTRAMUSCULAR | Status: DC | PRN

## 2015-09-30 MED ORDER — SERTRALINE HCL 50 MG PO TABS
150.0000 mg | ORAL_TABLET | Freq: Every day | ORAL | Status: DC
  Administered 2015-09-30: 150 mg via ORAL
  Filled 2015-09-30 (×2): qty 1

## 2015-09-30 MED ORDER — BISACODYL 10 MG RE SUPP: 10.0000 mg | Freq: Every day | RECTAL | Status: DC | PRN

## 2015-09-30 MED ORDER — METAXALONE 400 MG HALF TABLET
400.0000 mg | ORAL_TABLET | Freq: Three times a day (TID) | ORAL | Status: DC
  Administered 2015-09-30 – 2015-10-01 (×3): 400 mg via ORAL
  Filled 2015-09-30 (×6): qty 1

## 2015-09-30 MED ORDER — DOCUSATE SODIUM 100 MG PO CAPS
100.0000 mg | ORAL_CAPSULE | Freq: Two times a day (BID) | ORAL | Status: DC
  Administered 2015-09-30 – 2015-10-06 (×12): 100 mg via ORAL
  Filled 2015-09-30 (×13): qty 1

## 2015-09-30 MED ORDER — MAGNESIUM CITRATE PO SOLN: 1.0000 | Freq: Once | ORAL | Status: DC | PRN

## 2015-09-30 MED ORDER — ALUM & MAG HYDROXIDE-SIMETH 200-200-20 MG/5ML PO SUSP: 30.0000 mL | Freq: Four times a day (QID) | ORAL | Status: DC | PRN

## 2015-09-30 MED ORDER — ENOXAPARIN SODIUM 40 MG/0.4ML ~~LOC~~ SOLN
40.0000 mg | SUBCUTANEOUS | Status: DC
  Administered 2015-09-30 – 2015-10-04 (×5): 40 mg via SUBCUTANEOUS
  Filled 2015-09-30 (×5): qty 0.4

## 2015-09-30 MED ORDER — SODIUM CHLORIDE 0.9 % IV SOLN
INTRAVENOUS | Status: DC
  Administered 2015-09-30 – 2015-10-02 (×3): via INTRAVENOUS

## 2015-09-30 MED ORDER — SERTRALINE HCL 50 MG PO TABS: 50.0000 mg | ORAL_TABLET | Freq: Every day | ORAL | Status: DC

## 2015-09-30 MED ORDER — MAGNESIUM HYDROXIDE 400 MG/5ML PO SUSP
30.0000 mL | Freq: Every day | ORAL | Status: DC | PRN
Start: 2015-09-30 — End: 2015-10-06
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
## 2015-09-30 NOTE — Progress Notes (Signed)
MD Ghimire paged of patient admission to floor.

## 2015-09-30 NOTE — Care Management Note (Signed)
Case Management Note  Patient Details  Name: Sheri Cameron MRN: [id number] Date of Birth: 07/20/1981  Subjective/Objective:                  Date-10-28 Initial Assessment Spoke with patient at the bedside along with husband Sheri Cameron [phone].  Introduced self as Sports coachcase manager and explained role in discharge planning and how to be reached.  [place] with husband and a 32 year old son.  Verified patient anticipates to go home with spouse,  at time of discharge and will have part-time supervision by family at this time to best of their knowledge.  Patient has no DME. Expressed potential need for no other DME.  Patient denied   needing help with their medication.  Patient drives to MD appointments.  Verified patient [facility] or [facility]. Patient states they currently receive HH services through no one.    Plan: CM will continue to follow for discharge planning and [facility].   Lawerance Sabalebbie Augusta Mirkin RN BSN CM [phone]   Action/Plan:   Expected Discharge Date:                  Expected Discharge Plan:  Home/Self Care  In-House Referral:     Discharge planning Services  CM Consult  Post Acute Care Choice:    Choice offered to:     DME Arranged:    DME Agency:     HH Arranged:    HH Agency:     Status of Service:  In process, will continue to follow  Medicare Important Message Given:    Date Medicare IM Given:    Medicare IM give by:    Date Additional Medicare IM Given:    Additional Medicare Important Message give by:     If discussed at Long Length of Stay Meetings, dates discussed:    Additional Comments:  Lawerance SabalDebbie Refael Fulop, RN 09/30/2015, 2:26 PM

## 2015-09-30 NOTE — Progress Notes (Signed)
NURSING PROGRESS NOTE  Sheri Cameron [id number] Admission Data: 09/30/2015 3:01 PM Attending Provider: Clydie Braunondell A Smith, MD PCP:No PCP Per Patient Code Status: Full  Allergies:  Erythromycin and Augmentin Past Medical History:   has a past medical history of Anxiety; Anemia; Depression; Dyspareunia; Gastric ulcer; Dysmenorrhea; Asthma; Headache(784.0); and Endometriosis. Past Surgical History:   has past surgical history that includes Appendectomy; Cholecystectomy; Dilatation & currettage/hysteroscopy with resectoscope (N/A, 10/06/2013); laparoscopy (N/A, 10/06/2013); Cervical polypectomy (N/A, 10/06/2013); and Nexplanon Insertion (Left, 11/23/13). Social History:   reports that she has never smoked. She has never used smokeless tobacco. She reports that she does not drink alcohol or use illicit drugs.  Sheri Cameron is a 34 y.o. female patient admitted from ED:   Last Documented Vital Signs: Blood pressure 121/76, pulse 84, temperature 99.1 F (37.3 C), temperature source Oral, resp. rate 20, height 4\' 11"  (1.499 m), weight 52 kg (114 lb 10.2 oz), SpO2 100 %.   IV Fluids:  IV in place, occlusive dsg intact without redness, IV cath hand left, condition patent and no redness normal saline.   Skin: WDL  Patient/Family orientated to room. Information packet given to patient/family. Admission inpatient armband information verified with patient/family to include name and date of birth and placed on patient arm. Side rails up x 2, fall assessment and education completed with patient/family. Patient/family able to verbalize understanding of risk associated with falls and verbalized understanding to call for assistance before getting out of bed. Call light within reach. Patient/family able to voice and demonstrate understanding of unit orientation instructions.    Will continue to evaluate and treat per MD orders.   Leane PlattSpencer Keyonna Comunale RN, BS, BSN

## 2015-09-30 NOTE — Progress Notes (Signed)
Subjective/Objective Patient with multiple episodes of seizure-like activity. 1st episode around 8 pm was witnessed by nursing staff and patient was responsive and talking during episode. Received Ativan 1 mg OTO. 2nd witnessed episode around 10:40 pm. Resolved without intervention. Patient non-responsive after event.  Scheduled Meds: . docusate sodium  100 mg Oral BID  . enoxaparin (LOVENOX) injection  40 mg Subcutaneous Q24H  . metaxalone  400 mg Oral TID  . methylPREDNISolone (SOLU-MEDROL) injection  60 mg Intravenous Q6H  . sertraline  150 mg Oral QHS  . traZODone  100-150 mg Oral QHS   Continuous Infusions: . sodium chloride 75 mL/hr at 09/30/15 1510   PRN Meds:acetaminophen **OR** acetaminophen, alum & mag hydroxide-simeth, bisacodyl, HYDROcodone-acetaminophen, iohexol, iohexol, LORazepam, magnesium citrate, magnesium hydroxide, ondansetron **OR** ondansetron (ZOFRAN) IV  Vital signs in last 24 hours: Temp:  [98.2 F (36.8 C)-99.1 F (37.3 C)] 98.2 F (36.8 C) (10/28 2120) Pulse Rate:  [84-123] 122 (10/28 2223) Resp:  [18-20] 18 (10/28 2120) BP: (110-152)/(64-94) 117/64 mmHg (10/28 2223) SpO2:  [98 %-100 %] 98 % (10/28 2223) Weight:  [52 kg (114 lb 10.2 oz)] 52 kg (114 lb 10.2 oz) (10/28 1300)  Intake/Output last 3 shifts: I/O last 3 completed shifts: In: -  Out: 650 [Urine:650] Intake/Output this shift:  Brief focused physical exam: Physical Examination: General appearance - non-responsive. Vital signs stale Chest - clear to auscultation, no wheezes, rales or rhonchi, symmetric air entry Heart - normal rate and regular rhythm Neurological - Non responsive to verbal stimuli, resistant to tactile stimuli. Pupils equal and reactive.     Problem Assessment/Plan:   Seizure - like activity. Contacted Neurology, Dr. Leroy Kennedyamilo and ordered MRI brain, EEG as recommended. Will defer anti-seizure medications for now per Neuro. Will continue to monitor

## 2015-09-30 NOTE — Progress Notes (Signed)
Called patient placement Larita FifeLynn to assign provider.

## 2015-10-01 ENCOUNTER — Inpatient Hospital Stay (HOSPITAL_COMMUNITY): Payer: BLUE CROSS/BLUE SHIELD

## 2015-10-01 DIAGNOSIS — R509 Fever, unspecified: Secondary | ICD-10-CM

## 2015-10-01 DIAGNOSIS — M549 Dorsalgia, unspecified: Secondary | ICD-10-CM

## 2015-10-01 DIAGNOSIS — D72829 Elevated white blood cell count, unspecified: Secondary | ICD-10-CM

## 2015-10-01 DIAGNOSIS — K5900 Constipation, unspecified: Secondary | ICD-10-CM

## 2015-10-01 LAB — COMPREHENSIVE METABOLIC PANEL
ALBUMIN: 3.6 g/dL (ref 3.5–5.0)
ALK PHOS: 58 U/L (ref 38–126)
ALT: 17 U/L (ref 14–54)
AST: 18 U/L (ref 15–41)
Anion gap: 8 (ref 5–15)
BUN: 14 mg/dL (ref 6–20)
CALCIUM: 9 mg/dL (ref 8.9–10.3)
CHLORIDE: 100 mmol/L — AB (ref 101–111)
CO2: 24 mmol/L (ref 22–32)
CREATININE: 0.68 mg/dL (ref 0.44–1.00)
GFR calc Af Amer: >60 mL/min (ref >60)
GFR calc non Af Amer: >60 mL/min (ref >60)
GLUCOSE: 139 mg/dL — AB (ref 65–99)
Potassium: 4.3 mmol/L (ref 3.5–5.1)
SODIUM: 132 mmol/L — AB (ref 135–145)
Total Bilirubin: 0.8 mg/dL (ref 0.3–1.2)
Total Protein: 6.4 g/dL — ABNORMAL LOW (ref 6.5–8.1)

## 2015-10-01 LAB — CBC
HCT: 42.9 % (ref 36.0–46.0)
Hemoglobin: 13.9 g/dL (ref 12.0–15.0)
MCH: 28 pg (ref 26.0–34.0)
MCHC: 32.4 g/dL (ref 30.0–36.0)
MCV: 86.5 fL (ref 78.0–100.0)
PLATELETS: 390 10*3/uL (ref 150–400)
RBC: 4.96 MIL/uL (ref 3.87–5.11)
RDW: 13.4 % (ref 11.5–15.5)
WBC: 19.4 10*3/uL — ABNORMAL HIGH (ref 4.0–10.5)

## 2015-10-01 LAB — RPR: RPR: NONREACTIVE

## 2015-10-01 LAB — HIV ANTIBODY (ROUTINE TESTING W REFLEX): HIV Screen 4th Generation wRfx: NONREACTIVE

## 2015-10-01 LAB — RHEUMATOID FACTOR: Rhuematoid fact SerPl-aCnc: <10 IU/mL (ref 0.0–13.9)

## 2015-10-01 LAB — URINE CULTURE

## 2015-10-01 MED ORDER — METAXALONE 400 MG HALF TABLET
400.0000 mg | ORAL_TABLET | Freq: Two times a day (BID) | ORAL | Status: DC
  Administered 2015-10-01 – 2015-10-06 (×10): 400 mg via ORAL
  Filled 2015-10-01 (×12): qty 1

## 2015-10-01 MED ORDER — SERTRALINE HCL 100 MG PO TABS
100.0000 mg | ORAL_TABLET | Freq: Every day | ORAL | Status: DC
Start: 2015-10-01 — End: 2015-10-04
  Administered 2015-10-01 – 2015-10-03 (×3): 100 mg via ORAL
  Filled 2015-10-01 (×3): qty 1

## 2015-10-01 MED ORDER — GADOBENATE DIMEGLUMINE 529 MG/ML IV SOLN
10.0000 mL | Freq: Once | INTRAVENOUS | Status: AC | PRN
Start: 2015-10-01 — End: 2015-10-01
  Administered 2015-10-01: 10 mL via INTRAVENOUS
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
## 2015-10-01 NOTE — Progress Notes (Signed)
Called to pt room. Upon entering pt was actively having seizure or uncontrolled shaking. 1 of 5 while PT was working with her. These happened in a 45 min time period. Ativan 1 gm given per S.O. Will continue to monitor pt PRN Midge AverVicki Avrianna Smart RN
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
## 2015-10-01 NOTE — Progress Notes (Signed)
Stat EEG completed, results pending.  Neurologist notified.
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
## 2015-10-01 NOTE — Progress Notes (Signed)
Pt had seizure like activity beginning at 2010 for approximately 9 minutes. Ativan 1mg  IV given. Pt resting in bed with family at bedside. Will continue to monitor.

## 2015-10-01 NOTE — Progress Notes (Signed)
EEG staff entered room for test at approximately 0915 pt began seizure activity which lasted 3 min. Followed by another event at 9:19 lasting 30 sec. Another event at 9:23 lasting approximately 1 min with pt not responding until 2-3 min following last event. Pt now responsive talking and EEG is in progress.  Midge AverVicki Josemiguel Gries RN

## 2015-10-01 NOTE — Progress Notes (Signed)
RN notified by fellow RN of patient having seizure-like activity. First episode occurred around 2015. Patient experienced stiffness and jerking of entire body along with verbalization of discomfort, lasting approximately 10 minutes on and off. Ativan 1mg  given per Burnadette PeterLynch NP order. Patient had 3 more seizure-like episodes with same representation as first episode. However, patient was non-responsive during and after last episode occurring around 2215. Lynch NP on unit and notified by nursing staff. Lynch NP went to patient's bedside to assess patient and discuss care with patient's mother. Brooke, RR RN, notified after first and last episodes. Seizure-like episodes occurred when patient was safe in bed with nursing staff at bedside. Suction and oxygent set-up present. Patient's HR would elevate during episodes, greatest rate of 189. Burnadette PeterLynch, NP and Brooke RR notified of vitals including HR. Mother at bedside supportive of patient and nursing staff during episodes. Will continue to monitor patient.

## 2015-10-02 DIAGNOSIS — R569 Unspecified convulsions: Secondary | ICD-10-CM

## 2015-10-02 DIAGNOSIS — R29898 Other symptoms and signs involving the musculoskeletal system: Secondary | ICD-10-CM

## 2015-10-02 MED ORDER — LEVETIRACETAM 500 MG PO TABS
500.0000 mg | ORAL_TABLET | Freq: Two times a day (BID) | ORAL | Status: DC
  Administered 2015-10-02 – 2015-10-04 (×5): 500 mg via ORAL
  Filled 2015-10-02 (×6): qty 1

## 2015-10-02 MED ORDER — LEVETIRACETAM 500 MG/5ML IV SOLN
1000.0000 mg | Freq: Once | INTRAVENOUS | Status: AC
  Administered 2015-10-02: 1000 mg via INTRAVENOUS
  Filled 2015-10-02: qty 10

## 2015-10-02 MED ORDER — PREDNISONE 20 MG PO TABS
40.0000 mg | ORAL_TABLET | Freq: Every day | ORAL | Status: DC
  Administered 2015-10-03: 40 mg via ORAL
  Filled 2015-10-02: qty 2

## 2015-10-02 NOTE — Progress Notes (Addendum)
Patient had two seizure like movements/activity while in bed. Husband stated "it always happens after she gets up". HR 150s on telemetry. Patient following comands when asked to move hand from siderail. Patient asked to open eyes so RN could assess, but RN could not open patient's eyelids because they were not relaxed and were tensed like they were squeezed shut.   9:58 AM Family called RN for patient having an other episode. Patient awake and alert now asking for a coke to drink. Patient tearful.   1015: Dr. Mahala MenghiniSamtani made aware. Verbal order to d/c ativan, pad siderails and move patient to a camera room if one become available. Md verbal order that patient can not shower at this time.

## 2015-10-02 NOTE — Procedures (Signed)
ELECTROENCEPHALOGRAM REPORT   Patient: Sheri RayasMeagan N Heap       Room #: 1O105W10 EEG No. ID: [id number] Age: 34 y.o.        Sex: female Referring Physician: Mahala MenghiniSamtani Report Date:  10/02/2015        Interpreting Physician: Thana FarrEYNOLDS, [place]  History: [name] is an 34 y.o. female with multiple episodes per day of shaking and altered level of consciousness  Medications:  Scheduled: . docusate sodium  100 mg Oral BID  . enoxaparin (LOVENOX) injection  40 mg Subcutaneous Q24H  . metaxalone  400 mg Oral BID  . [START ON 10/03/2015] predniSONE  40 mg Oral QAC breakfast  . sertraline  100 mg Oral QHS  . traZODone  100-150 mg Oral QHS    Conditions of Recording:  This is a 16 channel EEG carried out with the patient in the drowsy and asleep states.  Description:  The patient is not fully awake during the recording for wakefulness to be evaluated.   During drowse the background activity is slow and irregular,low voltage theta and beta activity. The patient goes in to a light sleep with symmetrical sleep spindles, vertex central sharp transients and irregular slow activity.   No epileptiform activity is noted.  The patient has no episodes during the recording.   Hyperventilation was performed and produced a mild to moderate buildup but failed to elicit any abnormalities.  Intermittent photic stimulation was performed and elicits a symmetrical driving response but fails to elicit any abnormalities.  IMPRESSION: Normal electroencephalogram, drowsy, asleep and with activation procedures. There are no focal lateralizing or epileptiform features.   Thana FarrLeslie Nathanyl Andujo, MD [facility] [phone] 10/02/2015, 11:08 AM

## 2015-10-02 NOTE — Progress Notes (Signed)
Subjective: Patient did well overnight but has had multiple episodes today.  3 were seen by me while in consultation with the family.  The patient begins alert with shaking of both lower extremities then head goes to one side or the other and the patient does not respond.    Objective: Current vital signs: BP 97/63 mmHg  Pulse 65  Temp(Src) 98.5 F (36.9 C) (Oral)  Resp 12  Ht  (1.499 m)  Wt 52 kg (114 lb 10.2 oz)  BMI 23.14 kg/m2  SpO2 96% Vital signs in last 24 hours: Temp:  [98.5 F (36.9 C)-99.8 F (37.7 C)] 98.5 F (36.9 C) (10/30 0514) Pulse Rate:  [65-124] 65 (10/30 0514) Resp:  [12-20] 12 (10/30 0514) BP: (97-141)/(47-70) 97/63 mmHg (10/30 0514) SpO2:  [96 %-100 %] 96 % (10/30 0514)  Intake/Output from previous day: 10/29 0701 - 10/30 0700 In: 1607.5 [P.O.:600; I.V.:1007.5] Out: -  Intake/Output this shift: Total I/O In: 620 [P.O.:360; I.V.:260] Out: -  Nutritional status: Diet regular Room service appropriate?: Yes; Fluid consistency:: Thin  Neurologic Exam: Mental Status: Alert, oriented, thought content appropriate. Speech fluent without evidence of aphasia. Able to follow 3 step commands without difficulty. Cranial Nerves: II: Discs flat bilaterally; Visual fields grossly normal, pupils equal, round, reactive to light and accommodation III,IV, VI: ptosis not present, extra-ocular motions intact bilaterally V,VII: smile symmetric, facial light touch sensation normal bilaterally VIII: hearing normal bilaterally IX,X: uvula rises symmetrically XI: bilateral shoulder shrug XII: midline tongue extension without atrophy or fasciculations Motor: Patient in fetal position when I enter the room.  She turned unassisted to lay flat in the bed.  Gave 5/5 UE strength.  Could not lift legs off bed due to pain and extended legs so they could not be actively be flexed Sensory: sensation intact bilaterally Deep Tendon Reflexes:  3+ throughout Plantars: Right:  downgoingLeft: downgoing   Lab Results: Basic Metabolic Panel:  Recent Labs Lab 09/30/15 1450 10/01/15 0624  NA 133* 132*  K 3.8 4.3  CL 95* 100*  CO2 29 24  GLUCOSE 96 139*  BUN 10 14  CREATININE 0.76 0.68  CALCIUM 9.3 9.0  MG 2.2  --   PHOS 3.4  --     Liver Function Tests:  Recent Labs Lab 09/30/15 1450 10/01/15 0624  AST 17 18  ALT 19 17  ALKPHOS 63 58  BILITOT 1.1 0.8  PROT 6.7 6.4*  ALBUMIN 4.0 3.6   No results for input(s): LIPASE, AMYLASE in the last 168 hours. No results for input(s): AMMONIA in the last 168 hours.  CBC:  Recent Labs Lab 09/30/15 1450 10/01/15 0624  WBC 15.5* 19.4*  NEUTROABS 10.6*  --   HGB 14.3 13.9  HCT 43.6 42.9  MCV 85.8 86.5  PLT 394 390    Cardiac Enzymes:  Recent Labs Lab 09/30/15 1450  CKTOTAL 32*    Lipid Panel: No results for input(s): CHOL, TRIG, HDL, CHOLHDL, VLDL, LDLCALC in the last 168 hours.  CBG:  Recent Labs Lab 09/30/15 2221  GLUCAP 153*    Microbiology: Results for orders placed or performed during the hospital encounter of 09/30/15  Culture, blood (routine x 2)     Status: None (Preliminary result)   Collection Time: 09/30/15  2:50 PM  Result Value Ref Range Status   Specimen Description BLOOD RIGHT ANTECUBITAL  Final   Special Requests BOTTLES DRAWN AEROBIC AND ANAEROBIC 10CC  Final   Culture NO GROWTH 2 DAYS  Final  Report Status PENDING  Incomplete  Culture, blood (routine x 2)     Status: None (Preliminary result)   Collection Time: 09/30/15  2:55 PM  Result Value Ref Range Status   Specimen Description BLOOD LEFT ANTECUBITAL  Final   Special Requests BOTTLES DRAWN AEROBIC AND ANAEROBIC 10CC  Final   Culture NO GROWTH 2 DAYS  Final   Report Status PENDING  Incomplete  Urine culture     Status: None   Collection Time: 09/30/15  6:00 PM  Result Value Ref Range Status   Specimen Description URINE, CLEAN CATCH  Final   Special Requests NONE  Final    Culture MULTIPLE SPECIES PRESENT, SUGGEST RECOLLECTION  Final   Report Status 10/01/2015 FINAL  Final    Coagulation Studies: No results for input(s): LABPROT, INR in the last 72 hours.  Imaging: Dg Chest 2 View  10/01/2015  CLINICAL DATA:  Acute onset of fever.  Seizure.  Initial encounter. EXAM: CHEST  2 VIEW COMPARISON:  None. FINDINGS: The lungs are well-aerated and clear. There is no evidence of focal opacification, pleural effusion or pneumothorax. The heart is normal in size; the mediastinal contour is within normal limits. No acute osseous abnormalities are seen. IMPRESSION: No acute cardiopulmonary process seen. Electronically Signed   By: Roanna RaiderJeffery  Chang M.D.   On: 10/01/2015 01:48   Mr Laqueta JeanBrain W ZOWo Contrast  10/01/2015  CLINICAL DATA:  Multiple witnessed seizure like activity. History of headache. EXAM: MRI HEAD WITHOUT AND WITH CONTRAST TECHNIQUE: Multiplanar, multiecho pulse sequences of the brain and surrounding structures were obtained without and with intravenous contrast. CONTRAST:  10mL MULTIHANCE GADOBENATE DIMEGLUMINE 529 MG/ML IV SOLN COMPARISON:  MRI of the head March 08, 2015 FINDINGS: The ventricles and sulci are normal for patient's age. No abnormal parenchymal signal, mass lesions, mass effect. No abnormal parenchymal enhancement. No reduced diffusion to suggest acute ischemia. No susceptibility artifact to suggest hemorrhage. Bilateral hippocampus demonstrate normal size, morphology and signal. No abnormal extra-axial fluid collections. No extra-axial masses nor leptomeningeal enhancement. Normal major intracranial vascular flow voids seen at the skull base. Ocular globes and orbital contents are unremarkable though not tailored for evaluation. No suspicious calvarial bone marrow signal. No abnormal sellar expansion. Craniocervical junction maintained. Visualized paranasal sinuses and mastoid air cells are well-aerated. IMPRESSION: Normal MRI of the brain without without  contrast. Electronically Signed   By: Awilda Metroourtnay  Bloomer M.D.   On: 10/01/2015 01:50    Medications:  I have reviewed the patient's current medications. Scheduled: . docusate sodium  100 mg Oral BID  . enoxaparin (LOVENOX) injection  40 mg Subcutaneous Q24H  . metaxalone  400 mg Oral BID  . [START ON 10/03/2015] predniSONE  40 mg Oral QAC breakfast  . sertraline  100 mg Oral QHS  . traZODone  100-150 mg Oral QHS    Assessment/Plan: Patient with multiple episodes that clinically do not appear to be epileptic.  Despite this offered a diagnostic trial of anticonvulsants.  Family has refused this at this time due to their concern for side effects from anticonvulsant therapy.   Recommendations: 1.  D/C prn Ativan 2.  D/C steroids 3.  Since patient with multiple events per day would do prolonged monitoring tomorrow to attempt to capture events prior to investigating necessity for transfer to a EMU facility 4.  Seizure precautions  Case discussed with Dr. Mahala MenghiniSamtani   LOS: 2 days   Thana FarrLeslie Johnnae Impastato, MD [facility] [phone] 10/02/2015  11:13 AM

## 2015-10-02 NOTE — Progress Notes (Signed)
Patient asking if she can take a shower. Patient told we need and MD order for safety. Will communicate patient needs with MD when he comes assess patient.
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
## 2015-10-02 NOTE — Progress Notes (Addendum)
Patient had another seizure like episode while in the bed following using the bed pan. VSS. Patient's family wanting to try anticonvulsant therapy. Dr. Thad Rangereynolds paged and aware.

## 2015-10-03 ENCOUNTER — Inpatient Hospital Stay (HOSPITAL_COMMUNITY): Payer: BLUE CROSS/BLUE SHIELD

## 2015-10-03 DIAGNOSIS — R29818 Other symptoms and signs involving the nervous system: Secondary | ICD-10-CM

## 2015-10-03 DIAGNOSIS — R509 Fever, unspecified: Secondary | ICD-10-CM | POA: Insufficient documentation

## 2015-10-03 LAB — ANTINUCLEAR ANTIBODIES, IFA: ANTINUCLEAR ANTIBODIES, IFA: POSITIVE — AB

## 2015-10-03 LAB — FANA STAINING PATTERNS: Speckled Pattern: 1:640 {titer}

## 2015-10-03 LAB — VITAMIN D 25 HYDROXY (VIT D DEFICIENCY, FRACTURES): Vit D, 25-Hydroxy: 25 ng/mL — ABNORMAL LOW (ref 30.0–100.0)

## 2015-10-03 MED ORDER — PREDNISONE 10 MG PO TABS
10.0000 mg | ORAL_TABLET | Freq: Every day | ORAL | Status: DC
  Administered 2015-10-04 – 2015-10-06 (×3): 10 mg via ORAL
  Filled 2015-10-03 (×3): qty 1

## 2015-10-03 MED ORDER — VITAMIN D (ERGOCALCIFEROL) 1.25 MG (50000 UNIT) PO CAPS
50000.0000 [IU] | ORAL_CAPSULE | ORAL | Status: DC
  Administered 2015-10-03: 50000 [IU] via ORAL
  Filled 2015-10-03: qty 1

## 2015-10-03 NOTE — Progress Notes (Signed)
Prolonged EEG discontinued.

## 2015-10-03 NOTE — Progress Notes (Addendum)
Pt had two seizure-like episodes following my assessment. Vital signs stable. No telemetry changes. Pt going for EEG now. MD notified. Will continue to monitor.
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
## 2015-10-03 NOTE — Progress Notes (Signed)
[place] for Infectious Disease    Subjective:  more ? Pseudo seizures   Antibiotics:  Anti-infectives    None      Medications: Scheduled Meds: . docusate sodium  100 mg Oral BID  . enoxaparin (LOVENOX) injection  40 mg Subcutaneous Q24H  . levETIRAcetam  500 mg Oral BID  . metaxalone  400 mg Oral BID  . predniSONE  40 mg Oral QAC breakfast  . sertraline  100 mg Oral QHS  . traZODone  100-150 mg Oral QHS   Continuous Infusions:  PRN Meds:.acetaminophen **OR** acetaminophen, alum & mag hydroxide-simeth, bisacodyl, HYDROcodone-acetaminophen, iohexol, iohexol, magnesium citrate, magnesium hydroxide, ondansetron **OR** ondansetron (ZOFRAN) IV    Objective: Weight change:   Intake/Output Summary (Last 24 hours) at 10/03/15 1310 Last data filed at 10/03/15 1100  Gross per 24 hour  Intake    470 ml  Output   1350 ml  Net   -880 ml   Blood pressure 92/52, pulse 58, temperature 97.8 F (36.6 C), temperature source Oral, resp. rate 16, height 4' 11"  (1.499 m), weight 114 lb 10.2 oz (52 kg), SpO2 97 %. Temp:  [97.8 F (36.6 C)-99 F (37.2 C)] 97.8 F (36.6 C) (10/31 0546) Pulse Rate:  [58-117] 58 (10/31 0546) Resp:  [16-19] 16 (10/31 0546) BP: (92-130)/(52-72) 92/52 mmHg (10/31 0546) SpO2:  [97 %-100 %] 97 % (10/31 0546)  Physical Exam: General: Alert and awake, oriented x3, wearing EEG leads HEENT: anicteric sclera, EOMI CVS regular rate, normal r,  Chest: , no wheezing,resp distress Abdomen: soft nontender, nondistended, normal bowel sounds, Extremities: no  clubbing or edema noted bilaterally Neuro: nonfocal  CBC:  CBC Latest Ref Rng 10/01/2015 09/30/2015 09/22/2015  WBC 4.0 - 10.5 K/uL 19.4(H) 15.5(H) 13.5(H)  Hemoglobin 12.0 - 15.0 g/dL 13.9 14.3 13.6  Hematocrit 36.0 - 46.0 % 42.9 43.6 40.7  Platelets 150 - 400 K/uL 390 394 365      BMET  Recent Labs  09/30/15 1450 10/01/15 0624  NA 133* 132*  K 3.8 4.3  CL 95*  100*  CO2 29 24  GLUCOSE 96 139*  BUN 10 14  CREATININE 0.76 0.68  CALCIUM 9.3 9.0     Liver Panel   Recent Labs  09/30/15 1450 10/01/15 0624  PROT 6.7 6.4*  ALBUMIN 4.0 3.6  AST 17 18  ALT 19 17  ALKPHOS 63 58  BILITOT 1.1 0.8       Sedimentation Rate  Recent Labs  09/30/15 1450  ESRSEDRATE 5   C-Reactive Protein  Recent Labs  09/30/15 1450  CRP <0.5    Micro Results: Recent Results (from the past 720 hour(s))  Culture, blood (routine x 2)     Status: None (Preliminary result)   Collection Time: 09/30/15  2:50 PM  Result Value Ref Range Status   Specimen Description BLOOD RIGHT ANTECUBITAL  Final   Special Requests BOTTLES DRAWN AEROBIC AND ANAEROBIC 10CC  Final   Culture NO GROWTH 3 DAYS  Final   Report Status PENDING  Incomplete  Culture, blood (routine x 2)     Status: None (Preliminary result)   Collection Time: 09/30/15  2:55 PM  Result Value Ref Range Status   Specimen Description BLOOD LEFT ANTECUBITAL  Final   Special Requests BOTTLES DRAWN AEROBIC AND ANAEROBIC 10CC  Final   Culture NO GROWTH 3 DAYS  Final   Report Status PENDING  Incomplete  Urine culture  Status: None   Collection Time: 09/30/15  6:00 PM  Result Value Ref Range Status   Specimen Description URINE, CLEAN CATCH  Final   Special Requests NONE  Final   Culture MULTIPLE SPECIES PRESENT, SUGGEST RECOLLECTION  Final   Report Status 10/01/2015 FINAL  Final    Studies/Results: No results found.    Assessment/Plan:  INTERVAL HISTORY:  10/03/15: another event which sounds like pseudo seizure   Principal Problem:   Left leg weakness Active Problems:   Severe back pain   Fever   Leukocytosis   Constipation   Anorexia   Depression with anxiety   Seizure-like activity (HCC)    Sheri Cameron is a 34 y.o. female with  mutiiple neurological symptoms but looking more to be a pseudo-seizure , pscyhogenic issue. She reportedly also had fevers though none seen here.  She has had extensive workup for infection including MRI brain, MRI lumbar spine, normal ESR, CRP and zero evidence of infection.  No evidence for an Infectious Disease of any kind  I am signing off.  Please call with further questions.   LOS: 3 days   Alcide Evener 10/03/2015, 1:10 PM

## 2015-10-03 NOTE — Progress Notes (Signed)
On change of shift, as this Clinical research associatewriter was getting report from the off going RN, Lamar LaundrySonya in the patient's room,  patient was in the BR with Tech. Was concerned that patient could walk to the bathroom while on seizure precaution. Tammy, NT was assisting patient. As this Clinical research associatewriter proceeded to the next room with another RN to get report on the rest of the patients, Tammy, NT approach me and informed me of the fall. Patient was checked, V/S stable. Huddle was called in patient's room. This writer told patient that it was not safe to get up secondary to high fall risk. Patient not happy about that . Kathrine  Schoor called. Neuro WNL as will continue to monitor patient.

## 2015-10-03 NOTE — Procedures (Addendum)
History: 34 yo F with abnormal Spells.   Sedation: None  Technique: This is a 19 channel routine scalp EEG performed at the bedside with bipolar and monopolar montages arranged in accordance to the international 10/20 system of electrode placement. One channel was dedicated to EKG recording.    Background: The background consists of intermixed alpha and beta activities. There is a well defined posterior dominant rhythm of 12 Hz that attenuates with eye opening. At times, there is a slow variant PDR visualized. Sleep is not recorded.   There is occasional sharply contoured posterior activity that is superimposed on the posterior dominant rhythm. This appears concerning at times and is associated with her spells. With eye opening during her spells, however, this pattern is aborted without aborting her spell, confirming an artifactual,likely tremor artifact, nature to these discharges.   Photic stimulation: Physiologic driving is not performed  EEG Abnormalities: None  Clinical Interpretation: This normal EEG is recorded in the waking and sleep state. There was no seizure or seizure predisposition recorded on this study.   There were multiple events recorded consisting of bilateral leg stiffening and arm stiffening. There were no epileptic discharges associated with these events. This is consistent with non-epileptic, likely psychogenic, events.   Ritta SlotMcNeill Kirkpatrick, MD [facility] [phone]  If 7pm- 7am, please page neurology on call as listed in AMION.
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
## 2015-10-03 NOTE — Progress Notes (Signed)
Kathrine Schoor returned call @ 2027
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
## 2015-10-03 NOTE — Progress Notes (Addendum)
Received call per floor RN at 2240 regarding pt with pseudo seizures. Per RN Pt had witnessed fall earlier without injury while ambulating to BR. Pt has had several episodes of generalized stiffening with unresponsiveness this admission, and had several tonight prior to myself being called. Per Neurology consult notes these episodes are thought to be a conversion disorder, EEG negative for epileptic activity. Per [facility] MD Dr. Mahala MenghiniSamtani notes Psychiatric input is pending. Upon my assessment pt resting in bed with eyes closed tightly. Pt will not follow commands or speak. Extremities tighten to touch. VSS at this time, no respiratory compromise. RN advised to monitor Pt closely, suggested to have Ammonia salts on standby for next episode. Prior to leaving the floor Pt seen on camera attempting to get OOB with normal strength after her mother left the room. CNA to room and assisted Pt back into bed. Neurologist Dr. Hosie PoissonSumner updated on my assessment. RRT will monitor tonight.

## 2015-10-03 NOTE — Progress Notes (Signed)
Prolonged EEG started, event button at bedside. Pt and her mother instructed as well as nursing staff for use of event button. Monitoring in progress.

## 2015-10-04 DIAGNOSIS — F332 Major depressive disorder, recurrent severe without psychotic features: Secondary | ICD-10-CM

## 2015-10-04 DIAGNOSIS — F419 Anxiety disorder, unspecified: Secondary | ICD-10-CM

## 2015-10-04 MED ORDER — DULOXETINE HCL 30 MG PO CPEP
30.0000 mg | ORAL_CAPSULE | Freq: Every day | ORAL | Status: DC
  Administered 2015-10-04 – 2015-10-06 (×3): 30 mg via ORAL
  Filled 2015-10-04 (×3): qty 1

## 2015-10-04 MED ORDER — GABAPENTIN 100 MG PO CAPS
100.0000 mg | ORAL_CAPSULE | Freq: Two times a day (BID) | ORAL | Status: DC
  Administered 2015-10-04 – 2015-10-06 (×5): 100 mg via ORAL
  Filled 2015-10-04 (×5): qty 1

## 2015-10-04 MED ORDER — HYDROXYZINE HCL 25 MG PO TABS
50.0000 mg | ORAL_TABLET | Freq: Every day | ORAL | Status: DC
  Administered 2015-10-04 – 2015-10-05 (×2): 50 mg via ORAL
  Filled 2015-10-04 (×2): qty 2

## 2015-10-04 MED ORDER — SERTRALINE HCL 50 MG PO TABS
50.0000 mg | ORAL_TABLET | Freq: Every day | ORAL | Status: DC
  Administered 2015-10-04 – 2015-10-05 (×2): 50 mg via ORAL
  Filled 2015-10-04 (×2): qty 1

## 2015-10-04 NOTE — Progress Notes (Signed)
Pharmacist Provided - Patient Medication Education    Sheri Cameron is an 34 y.o. female [facility] with a chief complaint of No chief complaint on file.   Assessment: I reviewed all of the patients current medications with her. I educated the patient on her new medications cymbalta, hydroxyzine, gabapentin, metaxalone, and keppra. At the end of my visit the patient had no questions.   Time spent preparing for discharge counseling: 20 mins Time spent counseling patient: 15 mins  Remi HaggardAlyson N. Terrah Decoster, PharmD Clinical Pharmacist- Resident Pager: [phone]  Remi HaggardAlyson N Kinzlee Selvy, PharmD 10/04/2015, 6:22 PM
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
## 2015-10-04 NOTE — Progress Notes (Signed)
   10/03/15 1935  What Happened  Was fall witnessed? Yes  Who witnessed fall? (Tammy ,NT &  patient's mother)  Patients activity before fall bathroom-assisted  Point of contact arm/shoulder  Was patient injured? No  Follow Up  MD notified yes (NP called back @ 2027)  Time MD notified 2027  Family notified (Mother @ bedside)  Time family notified 1935  Additional tests No  Progress note created (see row info) Yes  Adult Fall Risk Assessment  Risk Factor Category (scoring not indicated) High fall risk per protocol (document High fall risk)  Patient's Fall Risk High Fall Risk (>13 points)  Adult Fall Risk Interventions  Required Bundle Interventions *See Row Information* High fall risk - low, moderate, and high requirements implemented  Additional Interventions Individualized elimination schedule;Room near nurses station  Fall with Injury Screening  Risk For Fall Injury- See Row Information  F (Falls @ home)  Pain Assessment  Pain Assessment 0-10  Pain Score 0  Neurological  Neuro (WDL) WDL  Level of Consciousness Alert  Orientation Level Oriented X4  Cognition Appropriate at baseline  Speech Clear  Pupil Assessment  Yes  Glasgow Coma Scale  Eye Opening 4  Best Verbal Response (NON-intubated) 5  Best Motor Response 6  Musculoskeletal  Musculoskeletal (WDL) X  Generalized Weakness Yes (leg tremors)  Integumentary  Integumentary (WDL) WDL  Pain Assessment  Result of Injury No
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
## 2015-10-04 NOTE — Progress Notes (Signed)
Pt's mother called & said pt.is having seizure.Went in the room & pt.is having episodes of shakiness & stiffness on her legs.V/S taken & maintain good airway & lasted for only 10 secs.& she suddenly had another episodes of short shakiness & stiffness for only few seconds for like 5 times.Rapid response nurse & MD on call Schorr made aware & Rapid response nurse came to see pt.Pt.doesn't open her eyes while having episodes of shakiness & stiffness.Neurology on call was also made aware.Will continue to monitor pt.

## 2015-10-05 DIAGNOSIS — F445 Conversion disorder with seizures or convulsions: Secondary | ICD-10-CM

## 2015-10-05 DIAGNOSIS — F319 Bipolar disorder, unspecified: Secondary | ICD-10-CM

## 2015-10-05 DIAGNOSIS — F449 Dissociative and conversion disorder, unspecified: Secondary | ICD-10-CM

## 2015-10-05 LAB — CULTURE, BLOOD (ROUTINE X 2)
CULTURE: NO GROWTH
Culture: NO GROWTH

## 2015-10-05 LAB — ANTI-DNA ANTIBODY, DOUBLE-STRANDED

## 2015-10-05 NOTE — Care Management Note (Signed)
Case Management Note  Patient Details  Name: Sheri Cameron MRN: [id number] Date of Birth: 10/11/1981  Subjective/Objective:      Date: 10/05/15 Spoke with patient at the bedside along with mother, who will be staying with patient at home after discharge. Introduced self as Sports coachcase manager and explained role in discharge planning and how to be reached. Verified patient lives in [place], with spouse and small child. has no DME. Expressed potential need for rolling walker and bsc. Verified patient anticipates to go home with family, at time of discharge and will have full-time supervision by mom at this time to best of their knowledge. Per CIR said no for inpatient and patient does not want to go to a SNF, she wants to go home to be with her family, so she chose HHPT. Patient  denied needing help with their medication.  Patient  driven by mom to MD appointments. Verified patient has PCP Mauricio Poegina [place]. P [facility], [facility], Mary notified.  Soc will begin 24-48 hrs post dc.  Plan: CM will continue to follow for discharge planning and [facility] resources.               Action/Plan:   Expected Discharge Date:                  Expected Discharge Plan:  Home w Home Health Services  In-House Referral:     Discharge planning Services  CM Consult  Post Acute Care Choice:    Choice offered to:  Patient  DME Arranged:    DME Agency:     HH Arranged:  PT HH Agency:  [facility]  Status of Service:  Completed, signed off  Medicare Important Message Given:    Date Medicare IM Given:    Medicare IM give by:    Date Additional Medicare IM Given:    Additional Medicare Important Message give by:     If discussed at Long Length of Stay Meetings, dates discussed:    Additional Comments:  Leone Havenaylor, Jerie Basford Clinton, RN 10/05/2015, 3:29 PM
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
## 2015-10-05 NOTE — Consult Note (Signed)
Karter Haire EdD 

## 2015-10-05 NOTE — Progress Notes (Signed)
Pt had seizure-like activity. Pt began to tremor in legs, arms and body. Pt kept eyes shut tightly and would not respond to any commands. Pts mom stated "she had three of these episodes". Vital signs taken and are stable. Schorr, NP made aware. Will continue to monitor pt.

## 2015-10-05 NOTE — Progress Notes (Signed)
Nurse called into room to check on pt. Pts lower extremities are shaking. Made Dr Rito EhrlichKrishnan aware. Will continue to monitor pt. Bed remains in lowest position. Call bell is within reach. Mom is at bedside.

## 2015-10-05 NOTE — Progress Notes (Addendum)
[facility] PROGRESS NOTE  Sheri Cameron WUJ:[id number] DOB: 1981/07/15 DOA: 09/30/2015  PCP: Dema Severin, NP  Brief HPI: 34 year old Caucasian female with a past medical history of bipolar disorder presented with complaints of lower back pain and lower extremity weakness and tremors. She was also experiencing "seizures". 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  Past medical history:  Past Medical History  Diagnosis Date  . Anxiety   . Anemia   . Depression   . Dyspareunia   . Gastric ulcer   . Dysmenorrhea   . Asthma     rarely and mild  . Headache(784.0)   . Endometriosis   . E-coli UTI     history of ecoli    Consultants: Infectious diseases, Neurology and psychiatry  Procedures:  Prolonged EEG 10/31 "Clinical Interpretation: This normal EEG is recorded in the waking and sleep state. There was no seizure or seizure predisposition recorded on this study. There were multiple events recorded consisting of bilateral leg stiffening and arm stiffening. There were no epileptic discharges associated with these events. This is consistent with non-epileptic, likely psychogenic, events. "  Antibiotics: None  Subjective: Patient complains of shooting pain in her left leg at times. Continues to have "spells". Her mother is at bedside.  Objective: Vital Signs  Filed Vitals:   10/04/15 1442 10/04/15 2105 10/04/15 2235 10/05/15 0613  BP: 109/59 125/83 105/66 98/54  Pulse: 81 97 70 73  Temp: 98.8 F (37.1 C)  98 F (36.7 C) 97.9 F (36.6 C)  TempSrc: Oral  Oral Oral  Resp: Height:      Weight:      SpO2: 98% 99% 100% 100%    Intake/Output Summary (Last 24 hours) at 10/05/15 0759 Last data filed at 10/05/15 9562  Gross per 24 hour  Intake    906 ml  Output    600 ml  Net    306 ml   Filed Weights   09/30/15 1300  Weight: 52 kg (114 lb 10.2 oz)    General appearance: alert, cooperative, appears stated age and no distress Resp: clear to  auscultation bilaterally Cardio: regular rate and rhythm, S1, S2 normal, no murmur, click, rub or gallop GI: soft, non-tender; bowel sounds normal; no masses,  no organomegaly Extremities: extremities normal, atraumatic, no cyanosis or edema Neurologic: No focal deficits. Unable to lift either of her lower extremities completely off the bed. But her weakness has not noted to be consistent with serial examinations.  Lab Results:  Basic Metabolic Panel:  Recent Labs Lab 09/30/15 1450 10/01/15 0624  NA 133* 132*  K 3.8 4.3  CL 95* 100*  CO2 29 24  GLUCOSE 96 139*  BUN 10 14  CREATININE 0.76 0.68  CALCIUM 9.3 9.0  MG 2.2  --   PHOS 3.4  --    Liver Function Tests:  Recent Labs Lab 09/30/15 1450 10/01/15 0624  AST 17 18  ALT 19 17  ALKPHOS 63 58  BILITOT 1.1 0.8  PROT 6.7 6.4*  ALBUMIN 4.0 3.6   CBC:  Recent Labs Lab 09/30/15 1450 10/01/15 0624  WBC 15.5* 19.4*  NEUTROABS 10.6*  --   HGB 14.3 13.9  HCT 43.6 42.9  MCV 85.8 86.5  PLT 394 390   Cardiac Enzymes:  Recent Labs Lab 09/30/15 1450  CKTOTAL 32*    CBG:  Recent Labs Lab 09/30/15 2221  GLUCAP 153*    Recent Results (from the past 240 hour(s))  Culture, blood (routine x 2)     Status: None (Preliminary result)   Collection Time: 09/30/15  2:50 PM  Result Value Ref Range Status   Specimen Description BLOOD RIGHT ANTECUBITAL  Final   Special Requests BOTTLES DRAWN AEROBIC AND ANAEROBIC 10CC  Final   Culture NO GROWTH 4 DAYS  Final   Report Status PENDING  Incomplete  Culture, blood (routine x 2)     Status: None (Preliminary result)   Collection Time: 09/30/15  2:55 PM  Result Value Ref Range Status   Specimen Description BLOOD LEFT ANTECUBITAL  Final   Special Requests BOTTLES DRAWN AEROBIC AND ANAEROBIC 10CC  Final   Culture NO GROWTH 4 DAYS  Final   Report Status PENDING  Incomplete  Urine culture     Status: None   Collection Time: 09/30/15  6:00 PM  Result Value Ref Range Status    Specimen Description URINE, CLEAN CATCH  Final   Special Requests NONE  Final   Culture MULTIPLE SPECIES PRESENT, SUGGEST RECOLLECTION  Final   Report Status 10/01/2015 FINAL  Final      Studies/Results: No results found.  Medications:  Scheduled: . docusate sodium  100 mg Oral BID  . DULoxetine  30 mg Oral Daily  . enoxaparin (LOVENOX) injection  40 mg Subcutaneous Q24H  . gabapentin  100 mg Oral BID  . hydrOXYzine  50 mg Oral QHS  . metaxalone  400 mg Oral BID  . predniSONE  10 mg Oral QAC breakfast  . sertraline  50 mg Oral QHS  . Vitamin D (Ergocalciferol)  50,000 Units Oral Q7 days   Continuous:  OZH:[id number]:acetaminophen **OR** acetaminophen, alum & mag hydroxide-simeth, bisacodyl, HYDROcodone-acetaminophen, iohexol, iohexol, magnesium citrate, magnesium hydroxide, ondansetron **OR** ondansetron (ZOFRAN) IV  Assessment/Plan:  Principal Problem:   Depression with anxiety Active Problems:   Left leg weakness   Severe back pain   Fever   Leukocytosis   Constipation   Anorexia   Seizure-like activity (HCC)   FUO (fever of unknown origin)    Left lower extremity weakness She has undergone extensive evaluation including MRI of the lumbar spine. She has been noted to surreptitiously use that leg when distracted. MRI brain also did not show any acute findings. She has been seen by neurology. Her symptoms appear to be secondary to conversion disorder. PT and OT is following. CIR was recommended. They have been consulted, although it's unlikely she will be a candidate for inpatient rehabilitation.  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 She has undergone prolonged EEG. During the EEG, she was noted to have these spells which did not correlate with epileptiform activity. She was initially started on Keppra, which will be discontinued. Discussed with Dr. Amada JupiterKirkpatrick today. Patient and mother reassured.  History of bipolar disorder and conversion disorder She has been seen by psychiatry,  who has adjusted her home medications. She is not a candidate for inpatient psychiatric treatment. She will follow-up with her outpatient provider, Dr. Evelene CroonKaur. Zoloft to be tapered off. Cymbalta has been initiated.  She was also seen by infectious disease for history of fever. However, no infectious etiology has been found. They have signed off.  DVT Prophylaxis: Lovenox    Code Status: Full code  Family Communication: Discussed with the patient and her mother  Disposition Plan: Anticipate discharge tomorrow. She may need home health.    LOS: 5 days   [facility] LLCKRISHNAN,Hasina Kreager  [facility] Pager [phone] 10/05/2015, 7:59 AM  If 7PM-7AM, please contact night-coverage at [email or web address], password [email or web address]

## 2015-10-06 DIAGNOSIS — F418 Other specified anxiety disorders: Secondary | ICD-10-CM

## 2015-10-06 MED ORDER — VITAMIN D (ERGOCALCIFEROL) 1.25 MG (50000 UNIT) PO CAPS: 50000.0000 [IU] | ORAL_CAPSULE | ORAL | Status: DC

## 2015-10-06 MED ORDER — GABAPENTIN 100 MG PO CAPS: 100.0000 mg | ORAL_CAPSULE | Freq: Two times a day (BID) | ORAL | Status: DC

## 2015-10-06 MED ORDER — SERTRALINE HCL 25 MG PO TABS: 50.0000 mg | ORAL_TABLET | Freq: Every day | ORAL | Status: DC

## 2015-10-06 MED ORDER — DOCUSATE SODIUM 100 MG PO CAPS: 100.0000 mg | ORAL_CAPSULE | Freq: Two times a day (BID) | ORAL | Status: DC

## 2015-10-06 MED ORDER — METAXALONE 400 MG PO TABS: 400.0000 mg | ORAL_TABLET | Freq: Two times a day (BID) | ORAL | Status: DC

## 2015-10-06 MED ORDER — HYDROXYZINE HCL 50 MG PO TABS: 50.0000 mg | ORAL_TABLET | Freq: Every day | ORAL | Status: DC

## 2015-10-06 MED ORDER — DULOXETINE HCL 30 MG PO CPEP: 30.0000 mg | ORAL_CAPSULE | Freq: Every day | ORAL | Status: DC

## 2015-10-06 MED ORDER — PREDNISONE 10 MG PO TABS: 10.0000 mg | ORAL_TABLET | Freq: Every day | ORAL | Status: DC

## 2015-10-06 NOTE — Care Management Note (Signed)
Case Management Note  Patient Details  Name: Willaim RayasMeagan N Quirk MRN: [id number] Date of Birth: 05/15/1981  Subjective/Objective:    NCM notified Corrie DandyMary with [facility] , patient is for dc today, she states patient is on the scheduled to be seen tomorrow.  Also Jermaine notified to bring up the rolling walker to patient's room  Before dc.  She decided she did not want the bsc right now.                Action/Plan:   Expected Discharge Date:                  Expected Discharge Plan:  Home w Home Health Services  In-House Referral:     Discharge planning Services  CM Consult  Post Acute Care Choice:    Choice offered to:  Patient  DME Arranged:    DME Agency:     HH Arranged:  PT HH Agency:  [facility]  Status of Service:  Completed, signed off  Medicare Important Message Given:    Date Medicare IM Given:    Medicare IM give by:    Date Additional Medicare IM Given:    Additional Medicare Important Message give by:     If discussed at Long Length of Stay Meetings, dates discussed:    Additional Comments:  Leone Havenaylor, Kilan Banfill Clinton, RN 10/06/2015, 11:30 AM

## 2015-10-06 NOTE — Progress Notes (Signed)
[facility] Discharge paperwork given and teach back used. All questions answered. IV discontinued and dressing clean, dry and intact. Walker given to patient and patient refused BSC at this time.
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
## 2015-10-06 NOTE — Discharge Instructions (Signed)
You will need to have your Vitamin D levels checked after 8 weeks of treatment.

## 2015-11-16 ENCOUNTER — Ambulatory Visit (INDEPENDENT_AMBULATORY_CARE_PROVIDER_SITE_OTHER): Payer: BLUE CROSS/BLUE SHIELD | Admitting: Obstetrics and Gynecology

## 2015-11-16 ENCOUNTER — Telehealth: Payer: Self-pay

## 2015-11-16 ENCOUNTER — Encounter: Payer: Self-pay | Admitting: Obstetrics and Gynecology

## 2015-11-16 DIAGNOSIS — Z01419 Encounter for gynecological examination (general) (routine) without abnormal findings: Secondary | ICD-10-CM

## 2015-11-16 DIAGNOSIS — Z7689 Persons encountering health services in other specified circumstances: Secondary | ICD-10-CM | POA: Diagnosis not present

## 2015-11-16 DIAGNOSIS — Z113 Encounter for screening for infections with a predominantly sexual mode of transmission: Secondary | ICD-10-CM

## 2015-11-16 DIAGNOSIS — Z7189 Other specified counseling: Secondary | ICD-10-CM
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
## 2015-11-16 NOTE — Telephone Encounter (Signed)
Patient called back she said she wanted everything done std panel and GC/CT included.

## 2015-11-16 NOTE — Patient Instructions (Addendum)

## 2015-11-16 NOTE — Telephone Encounter (Signed)
Called patient to verify if she wanted STD panel done on her bloodwork this morning and if she wanted GC/CT done also?  Per Dr. Edward JollySilva, patient did not discuss this with her.  Left message for patient to call me.

## 2015-11-16 NOTE — Telephone Encounter (Signed)
STD testing was added by me.  Encounter closed.

## 2015-11-17 LAB — LIPID PANEL
CHOLESTEROL: 176 mg/dL (ref 125–200)
HDL: 43 mg/dL — AB (ref >=46)
LDL Cholesterol: 98 mg/dL (ref <130)
TRIGLYCERIDES: 175 mg/dL — AB (ref <150)
Total CHOL/HDL Ratio: 4.1 Ratio (ref <=5.0)
VLDL: 35 mg/dL — ABNORMAL HIGH (ref <30)

## 2015-11-17 LAB — COMPREHENSIVE METABOLIC PANEL
ALT: 14 U/L (ref 6–29)
AST: 14 U/L (ref 10–30)
Albumin: 3.9 g/dL (ref 3.6–5.1)
Alkaline Phosphatase: 53 U/L (ref 33–115)
BUN: 10 mg/dL (ref 7–25)
CHLORIDE: 102 mmol/L (ref 98–110)
CO2: 26 mmol/L (ref 20–31)
Calcium: 9.2 mg/dL (ref 8.6–10.2)
Creat: 0.64 mg/dL (ref 0.50–1.10)
Glucose, Bld: 67 mg/dL (ref 65–99)
POTASSIUM: 3.8 mmol/L (ref 3.5–5.3)
Sodium: 137 mmol/L (ref 135–146)
TOTAL PROTEIN: 6.4 g/dL (ref 6.1–8.1)
Total Bilirubin: 0.9 mg/dL (ref 0.2–1.2)

## 2015-11-17 LAB — STD PANEL
HIV: NONREACTIVE
Hepatitis B Surface Ag: NEGATIVE

## 2015-11-17 LAB — CBC
HCT: 41.7 % (ref 36.0–46.0)
Hemoglobin: 13.5 g/dL (ref 12.0–15.0)
MCH: 27.8 pg (ref 26.0–34.0)
MCHC: 32.4 g/dL (ref 30.0–36.0)
MCV: 86 fL (ref 78.0–100.0)
MPV: 9.6 fL (ref 8.6–12.4)
PLATELETS: 322 10*3/uL (ref 150–400)
RBC: 4.85 MIL/uL (ref 3.87–5.11)
RDW: 13.8 % (ref 11.5–15.5)
WBC: 8 10*3/uL (ref 4.0–10.5)

## 2015-11-17 LAB — HEPATITIS C ANTIBODY: HCV AB: NEGATIVE

## 2015-11-17 LAB — VITAMIN D 25 HYDROXY (VIT D DEFICIENCY, FRACTURES): VIT D 25 HYDROXY: 38 ng/mL (ref 30–100)

## 2015-11-19 LAB — IPS N GONORRHOEA AND CHLAMYDIA BY PCR

## 2015-11-19 LAB — RUBELLA ANTIBODY, IGM: RUBELLA: 0.42 (ref <0.91)

## 2015-11-21 LAB — IPS PAP TEST WITH HPV

## 2016-01-13 DIAGNOSIS — F445 Conversion disorder with seizures or convulsions: Secondary | ICD-10-CM | POA: Insufficient documentation

## 2016-01-13 DIAGNOSIS — R569 Unspecified convulsions: Secondary | ICD-10-CM | POA: Insufficient documentation
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
## 2016-05-07 ENCOUNTER — Ambulatory Visit (INDEPENDENT_AMBULATORY_CARE_PROVIDER_SITE_OTHER): Payer: BLUE CROSS/BLUE SHIELD | Admitting: Obstetrics and Gynecology

## 2016-05-07 ENCOUNTER — Telehealth: Payer: Self-pay | Admitting: Obstetrics and Gynecology

## 2016-05-07 ENCOUNTER — Encounter: Payer: Self-pay | Admitting: Obstetrics and Gynecology

## 2016-05-07 VITALS — BP 100/64 | HR 70 | Ht 59.0 in | Wt 115.2 lb

## 2016-05-07 DIAGNOSIS — N926 Irregular menstruation, unspecified: Secondary | ICD-10-CM

## 2016-05-07 DIAGNOSIS — N949 Unspecified condition associated with female genital organs and menstrual cycle: Secondary | ICD-10-CM | POA: Diagnosis not present

## 2016-05-07 DIAGNOSIS — R102 Pelvic and perineal pain: Secondary | ICD-10-CM

## 2016-05-07 LAB — POCT URINE PREGNANCY: Preg Test, Ur: NEGATIVE

## 2016-05-07 MED ORDER — NORETHIN ACE-ETH ESTRAD-FE 1-20 MG-MCG PO TABS: 1.0000 | ORAL_TABLET | Freq: Every day | ORAL | Status: DC

## 2016-05-07 NOTE — Progress Notes (Signed)
GYNECOLOGY  VISIT   HPI: 35 y.o.   Married  Caucasian  female   G2P0020 with Patient's last menstrual period was 05/07/2016 (exact date).   here for   Bleeding with Nexplanon for 3weeks with cramping, nausea, headaches. This is a monthly occurrence.  Can change pad 3 times per day.   Feels like the cramping is starting again.   Nexplanon inserted 12/14. Patient used Depo Provera in past, which was not effective for controlling cycles.  OCPs in past also did not help control cramping and bleeding.   Considering pregnancy for September.  Waiting on job promotion.   UPT today - negative.  GYNECOLOGIC HISTORY: Patient's last menstrual period was 05/07/2016 (exact date). Contraception:  Nexplanon Menopausal hormone therapy:  n/a Last mammogram:  n/a Last pap smear:   Pap and HR HPV negative        OB History    Gravida Para Term Preterm AB TAB SAB Ectopic Multiple Living   Patient Active Problem List   Diagnosis Date Noted  . Pseudoseizures   . FUO (fever of unknown origin)   . Seizure-like activity (HCC)   . Left leg weakness 09/30/2015  . Severe back pain 09/30/2015  . Fever 09/30/2015  . Leukocytosis 09/30/2015  . Constipation 09/30/2015  . Anorexia 09/30/2015  . Depression with anxiety 09/30/2015  . Pelvic pain in female 04/13/2014  . Endometriosis 04/13/2014  . Acute right lower quadrant pain 04/13/2014    Past Medical History  Diagnosis Date  . Anxiety   . Anemia   . Depression   . Dyspareunia   . Gastric ulcer   . Dysmenorrhea   . Asthma     rarely and mild  . Headache(784.0)   . Endometriosis   . E-coli UTI     history of ecoli    Past Surgical History  Procedure Laterality Date  . Appendectomy    . Cholecystectomy    . Dilatation & currettage/hysteroscopy with resectocope N/A 10/06/2013    Procedure: HYSTEROSCOPY WITH RESECTION OF UTERINE SEPTUM;  Surgeon: Melony Overly, MD;  Location: [facility];  Service: Gynecology;   Laterality: N/A;  resection of uterine septum.   . Laparoscopy N/A 10/06/2013    Procedure: LAPAROSCOPY DIAGNOSTIC, FULGERATION OF ENDOMETRIOSIS;  Surgeon: Melony Overly, MD;  Location: [facility];  Service: Gynecology;  Laterality: N/A;  . Cervical polypectomy N/A 10/06/2013    Procedure: CERVICAL POLYPECTOMY;  Surgeon: Melony Overly, MD;  Location: [facility];  Service: Gynecology;  Laterality: N/A;  . Nexplanon insertion Left 11/23/13    Current Outpatient Prescriptions  Medication Sig Dispense Refill  . DULoxetine (CYMBALTA) 60 MG capsule Take 60 mg by mouth every morning.  12  . etonogestrel (NEXPLANON) 68 MG IMPL implant Inject 1 each into the skin once.    . gabapentin (NEURONTIN) 100 MG capsule Take 1 capsule (100 mg total) by mouth 2 (two) times daily. 60 capsule 1  . traZODone (DESYREL) 100 MG tablet Take 200 mg by mouth at bedtime.  12  . Vitamin D, Ergocalciferol, (DRISDOL) 50000 UNITS CAPS capsule Take 1 capsule (50,000 Units total) by mouth every 7 (seven) days. 8 capsule 0   No current facility-administered medications for this visit.     ALLERGIES: Augmentin and Erythromycin  Family History  Problem Relation Age of Onset  . Cancer Father     cholangiocarcinoma  . Breast  cancer Paternal Grandmother   . Hypertension Paternal Grandmother   . Diabetes Paternal Grandmother   . Cancer Paternal Grandfather     cholangiocarcinoma/liver ca  . Cancer Maternal Grandfather     pancreatic cancer  . Ovarian cancer Maternal Aunt   . Hypertension Maternal Grandmother   . Diabetes Maternal Grandmother     Social History   Social History  . Marital Status: Married    Spouse Name: N/A  . Number of Children: N/A  . Years of Education: N/A   Occupational History  . Not on file.   Social History Main Topics  . Smoking status: Never Smoker   . Smokeless tobacco: Never Used  . Alcohol Use: No  . Drug Use: No  . Sexual Activity:    Partners: Male    Birth Control/ Protection:  Implant     Comment: Nexplanon inserted 11-23-13 (left arm)   Other Topics Concern  . Not on file   Social History Narrative    ROS:  Pertinent items are noted in HPI.  PHYSICAL EXAMINATION:    BP 100/64 mmHg  Pulse 70  Ht 4\' 11"  (1.499 m)  Wt 115 lb 3.2 oz (52.254 kg)  BMI 23.25 kg/m2  LMP 05/07/2016 (Exact Date)    General appearance: alert, cooperative and appears stated age.  Pelvic: External genitalia:  no lesions              Urethra:  normal appearing urethra with no masses, tenderness or lesions              Bartholins and Skenes: normal                 Vagina: normal appearing vagina with normal color and discharge, no lesions              Cervix: no lesions and menstrual spotting.     Bimanual Exam:  Uterus:  normal size, contour, position, consistency, mobility, non-tender              Adnexa: normal adnexa and no mass, fullness, tenderness              Chaperone was present for exam.  ASSESSMENT  Metrorrhagia.  Nexplanon patient.  Hx endometriosis.  Desire for future fertility.  Hx endometriosis by laparoscopy.  Hx metroplasty.  Status post HSG post op showing patent tubes bilaterally and absence of uterine septum.  PLAN  Check Hgb today - 13.0. Start PNV.  Add Loestrin 1/20 to regimen for the next 6 months.  Call office to schedule Nexaplanon removal when desired but at least by the end of December 2017.  Annual exam end of December 2017.     An After Visit Summary was printed and given to the patient.  __15____ minutes face to face time of which over 50% was spent in counseling.

## 2016-05-07 NOTE — Telephone Encounter (Signed)
Patient has been bleeding for 3 weeks with the nexplanon.

## 2016-05-07 NOTE — Telephone Encounter (Signed)
Call to patient. She states that every month she is having extended cycles and this month she has been having vaginal bleeding for 3 weeks. Today, not heavy bleeding, states "right now, its much lighter, I am just sick of bleeding so much every month."   Office visit scheduled for today with Dr. Edward JollySilva at 1430. Patient agreeable.   Routing to provider for final review. Patient agreeable to disposition. Will close encounter.

## 2016-05-21 LAB — HEMOGLOBIN, FINGERSTICK: Hemoglobin, fingerstick: 13.3 g/dL (ref 12.0–16.0)

## 2016-10-15 ENCOUNTER — Telehealth: Payer: Self-pay | Admitting: Obstetrics and Gynecology

## 2016-10-15 DIAGNOSIS — Z3009 Encounter for other general counseling and advice on contraception: Secondary | ICD-10-CM

## 2016-10-15 NOTE — Telephone Encounter (Signed)
Left message to call Noreene LarssonJill at [phone].   Order placed for implanon removal and insertion.   Cc: Braxton Feathersebecca Frahm

## 2016-10-15 NOTE — Telephone Encounter (Signed)
Spoke with patient. Advised as seen below. Patient states she is traveling with work and will need to speak with her boss for available time for scheduling. Also advised patient she was previously scheduled for Nexplanon insertion and removal on same day as AEX -attempted to reschedule on different days, patient states she can't at this time. Patient verbalizes understanding and will return call for scheduling.

## 2016-10-15 NOTE — Telephone Encounter (Signed)
I would recommend she come in for evaluation

## 2016-10-15 NOTE — Telephone Encounter (Signed)
Patient says she has been bleeding everyday and thinks it may be from her medication. She also scheduled her removal/insertion of nexplanon if orders can be placed in computer.

## 2016-10-15 NOTE — Telephone Encounter (Signed)
Spoke with patient. Patient states she has nexplanon in place scheduled for replacement 11/21/16 with Dr. Edward JollySilva. Patient states she goes back and forth from heavy bleeding to spotting lasting for weeks at a time. LMP 10/02/16 -still on menses. Patient states she is tired all of the time and reports no energy. Patient denies dizziness. Patient reports taking Loestrin daly with no missed pills or doses- patient states started after 05/07/16 OV. Patient states cycles have never been regular and would like to know what to do. Advised Dr. Edward JollySilva out of the office, will review with covering provider for recommendations. Patient is agreeable.    Dr. Oscar LaJertson, please advise?   CC: Dr. Edward JollySilva
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
## 2016-11-21 ENCOUNTER — Encounter: Payer: Self-pay | Admitting: Obstetrics and Gynecology

## 2016-11-21 ENCOUNTER — Ambulatory Visit (INDEPENDENT_AMBULATORY_CARE_PROVIDER_SITE_OTHER): Payer: BLUE CROSS/BLUE SHIELD | Admitting: Obstetrics and Gynecology

## 2016-11-21 VITALS — BP 102/64 | HR 60 | Resp 14 | Ht 58.5 in | Wt 109.0 lb

## 2016-11-21 DIAGNOSIS — N926 Irregular menstruation, unspecified: Secondary | ICD-10-CM | POA: Diagnosis not present

## 2016-11-21 DIAGNOSIS — F439 Reaction to severe stress, unspecified: Secondary | ICD-10-CM | POA: Diagnosis not present

## 2016-11-21 DIAGNOSIS — Z30017 Encounter for initial prescription of implantable subdermal contraceptive: Secondary | ICD-10-CM

## 2016-11-21 DIAGNOSIS — R5383 Other fatigue: Secondary | ICD-10-CM

## 2016-11-21 DIAGNOSIS — Z3046 Encounter for surveillance of implantable subdermal contraceptive: Secondary | ICD-10-CM | POA: Diagnosis not present

## 2016-11-21 DIAGNOSIS — Z308 Encounter for other contraceptive management: Secondary | ICD-10-CM | POA: Diagnosis not present

## 2016-11-21 DIAGNOSIS — Z3009 Encounter for other general counseling and advice on contraception: Secondary | ICD-10-CM | POA: Diagnosis not present

## 2016-11-21 DIAGNOSIS — E559 Vitamin D deficiency, unspecified: Secondary | ICD-10-CM

## 2016-11-21 DIAGNOSIS — E781 Pure hyperglyceridemia: Secondary | ICD-10-CM | POA: Diagnosis not present

## 2016-11-21 LAB — COMPREHENSIVE METABOLIC PANEL
ALK PHOS: 64 U/L (ref 33–115)
ALT: 11 U/L (ref 6–29)
AST: 13 U/L (ref 10–30)
Albumin: 4.2 g/dL (ref 3.6–5.1)
BILIRUBIN TOTAL: 0.8 mg/dL (ref 0.2–1.2)
BUN: 8 mg/dL (ref 7–25)
CALCIUM: 9 mg/dL (ref 8.6–10.2)
CO2: 25 mmol/L (ref 20–31)
Chloride: 104 mmol/L (ref 98–110)
Creat: 0.79 mg/dL (ref 0.50–1.10)
GLUCOSE: 80 mg/dL (ref 65–99)
POTASSIUM: 3.9 mmol/L (ref 3.5–5.3)
Sodium: 137 mmol/L (ref 135–146)
Total Protein: 6.4 g/dL (ref 6.1–8.1)

## 2016-11-21 LAB — CBC
HEMATOCRIT: 39.7 % (ref 35.0–45.0)
HEMOGLOBIN: 13 g/dL (ref 11.7–15.5)
MCH: 27.9 pg (ref 27.0–33.0)
MCHC: 32.7 g/dL (ref 32.0–36.0)
MCV: 85.2 fL (ref 80.0–100.0)
MPV: 9.5 fL (ref 7.5–12.5)
PLATELETS: 417 10*3/uL — AB (ref 140–400)
RBC: 4.66 MIL/uL (ref 3.80–5.10)
RDW: 13.5 % (ref 11.0–15.0)
WBC: 9.7 10*3/uL (ref 3.8–10.8)

## 2016-11-21 LAB — LIPID PANEL
Cholesterol: 138 mg/dL (ref <200)
HDL: 36 mg/dL — AB (ref >50)
LDL CALC: 82 mg/dL (ref <100)
TRIGLYCERIDES: 99 mg/dL (ref <150)
Total CHOL/HDL Ratio: 3.8 Ratio (ref <5.0)
VLDL: 20 mg/dL (ref <30)

## 2016-11-21 LAB — TSH: TSH: 1.68 m[IU]/L

## 2016-11-21 NOTE — Progress Notes (Deleted)
35 y.o. 92P0020 Married {Race/ethnicity:17218} female here for annua44l exam.    PCP:     No LMP recorded. Patient has had an implant.           Sexually active: {yes no:314532}  The current method of family planning is {contraception:315051}.    Exercising: {yes no:314532}  {types:19826} Smoker:  {YES NO:22349}  Health Maintenance: Pap:  *** History of abnormal Pap:  {YES NO:22349} MMG:  *** Colonoscopy:  *** BMD:   ***  Result  *** TDaP:  *** Gardasil:   {YES NO:22349} HIV: Hep C: Screening Labs:  Hb today: ***, Urine today: ***   reports that she has never smoked. She has never used smokeless tobacco. She reports that she does not drink alcohol or use drugs.  Past Medical History:  Diagnosis Date  . Anemia   . Anxiety   . Asthma    rarely and mild  . Depression   . Dysmenorrhea   . Dyspareunia   . E-coli UTI    history of ecoli  . Endometriosis   . Gastric ulcer   . YNWGNFAO(130.8Headache(784.0)     Past Surgical History:  Procedure Laterality Date  . APPENDECTOMY    . CERVICAL POLYPECTOMY N/A 10/06/2013   Procedure: CERVICAL POLYPECTOMY;  Surgeon: Melony OverlyBrook A Silva, MD;  Location: [facility];  Service: Gynecology;  Laterality: N/A;  . CHOLECYSTECTOMY    . DILATATION & CURRETTAGE/HYSTEROSCOPY WITH RESECTOCOPE N/A 10/06/2013   Procedure: HYSTEROSCOPY WITH RESECTION OF UTERINE SEPTUM;  Surgeon: Melony OverlyBrook A Silva, MD;  Location: [facility];  Service: Gynecology;  Laterality: N/A;  resection of uterine septum.   Marland Kitchen. LAPAROSCOPY N/A 10/06/2013   Procedure: LAPAROSCOPY DIAGNOSTIC, FULGERATION OF ENDOMETRIOSIS;  Surgeon: Melony OverlyBrook A Silva, MD;  Location: [facility];  Service: Gynecology;  Laterality: N/A;  . Nexplanon Insertion Left 11/23/13    Current Outpatient Prescriptions  Medication Sig Dispense Refill  . etonogestrel (NEXPLANON) 68 MG IMPL implant Inject 1 each into the skin once.    . norethindrone-ethinyl estradiol (JUNEL FE,GILDESS FE,LOESTRIN FE) 1-20 MG-MCG tablet Take 1 tablet by mouth daily. 1  Package 5   No current facility-administered medications for this visit.     Family History  Problem Relation Age of Onset  . Cancer Father     cholangiocarcinoma  . Breast cancer Paternal Grandmother   . Hypertension Paternal Grandmother   . Diabetes Paternal Grandmother   . Cancer Paternal Grandfather     cholangiocarcinoma/liver ca  . Cancer Maternal Grandfather     pancreatic cancer  . Ovarian cancer Maternal Aunt   . Hypertension Maternal Grandmother   . Diabetes Maternal Grandmother     ROS:  Pertinent items are noted in HPI.  Otherwise, a comprehensive ROS was negative.  Exam:   There were no vitals taken for this visit.    General appearance: alert, cooperative and appears stated age Head: Normocephalic, without obvious abnormality, atraumatic Neck: no adenopathy, supple, symmetrical, trachea midline and thyroid normal to inspection and palpation Lungs: clear to auscultation bilaterally Breasts: normal appearance, no masses or tenderness, No nipple retraction or dimpling, No nipple discharge or bleeding, No axillary or supraclavicular adenopathy Heart: regular rate and rhythm Abdomen: soft, non-tender; no masses, no organomegaly Extremities: extremities normal, atraumatic, no cyanosis or edema Skin: Skin color, texture, turgor normal. No rashes or lesions Lymph nodes: Cervical, supraclavicular, and axillary nodes normal. No abnormal inguinal nodes palpated Neurologic: Grossly normal  Pelvic: External genitalia:  no lesions  Urethra:  normal appearing urethra with no masses, tenderness or lesions              Bartholins and Skenes: normal                 Vagina: normal appearing vagina with normal color and discharge, no lesions              Cervix: no lesions              Pap taken: {yes no:314532} Bimanual Exam:  Uterus:  normal size, contour, position, consistency, mobility, non-tender              Adnexa: no mass, fullness, tenderness               Rectal exam: {yes no:314532}.  Confirms.              Anus:  normal sphincter tone, no lesions  Chaperone was present for exam.  Assessment:   Well woman visit with normal exam.   Plan: Yearly mammogram recommended after age 35.  Recommended self breast exam.  Pap and HR HPV as above. Discussed Calcium, Vitamin D, regular exercise program including cardiovascular and weight bearing exercise.   Follow up annually and prn.   Additional counseling given.  {yes [id number]:314532}. _______ minutes face to face time of which over 50% was spent in counseling.    After visit summary provided.

## 2016-11-21 NOTE — Patient Instructions (Signed)

## 2016-11-21 NOTE — Progress Notes (Signed)
GYNECOLOGY  VISIT   HPI: 35 y.o.   Married  Caucasian  female   G2P0020 with Patient's last menstrual period was 11/17/2016.   here for   Nexplanon removal, discuss possible birth control options. Wanted to have annual exam, problem visit and Nexplanon resolution all in the same day.  Nexplanon due for removal.  Menses are now occurring every 2 weeks.  Husband is noting mood swings in the patient. She is having daily headaches.  Husband is pressuring to move forward with childbearing.  patient a 15 year old step son, who is the husband's child. Child is at home with them most of the time and very little with the mother. There are behavior issues that are becoming pervasive, and there are concerns about the patient's biological mother's care of the son. Unable to get into counseling without approval of mother. Patient feeling like there are not easy solutions.   Not on PNV.  On Zoloft currently and "cannot get off of it." Has weaned down.   Tired.  Has hx of low vit D.   GYNECOLOGIC HISTORY: Patient's last menstrual period was 11/17/2016. Contraception:  Nexplanon inserted 11/2013 in Left Arm Menopausal hormone therapy:  n/a Last mammogram:  n/a Last pap smear:   11/16/15 Pap and HR HPV negative        OB History    Gravida Para Term Preterm AB Living   2       2 0   SAB TAB Ectopic Multiple Live Births   2                 Patient Active Problem List   Diagnosis Date Noted  . Pseudoseizures   . FUO (fever of unknown origin)   . Seizure-like activity (HCC)   . Left leg weakness 09/30/2015  . Severe back pain 09/30/2015  . Fever 09/30/2015  . Leukocytosis 09/30/2015  . Constipation 09/30/2015  . Anorexia 09/30/2015  . Depression with anxiety 09/30/2015  . Pelvic pain in female 04/13/2014  . Endometriosis 04/13/2014  . Acute right lower quadrant pain 04/13/2014    Past Medical History:  Diagnosis Date  . Anemia   . Anxiety   . Asthma    rarely and mild  .  Depression   . Dysmenorrhea   . Dyspareunia   . E-coli UTI    history of ecoli  . Endometriosis   . Gastric ulcer   . WUJWJXBJ(478.2)     Past Surgical History:  Procedure Laterality Date  . APPENDECTOMY    . CERVICAL POLYPECTOMY N/A 10/06/2013   Procedure: CERVICAL POLYPECTOMY;  Surgeon: Melony Overly, MD;  Location: [facility];  Service: Gynecology;  Laterality: N/A;  . CHOLECYSTECTOMY    . DILATATION & CURRETTAGE/HYSTEROSCOPY WITH RESECTOCOPE N/A 10/06/2013   Procedure: HYSTEROSCOPY WITH RESECTION OF UTERINE SEPTUM;  Surgeon: Melony Overly, MD;  Location: [facility];  Service: Gynecology;  Laterality: N/A;  resection of uterine septum.   Marland Kitchen LAPAROSCOPY N/A 10/06/2013   Procedure: LAPAROSCOPY DIAGNOSTIC, FULGERATION OF ENDOMETRIOSIS;  Surgeon: Melony Overly, MD;  Location: [facility];  Service: Gynecology;  Laterality: N/A;  . Nexplanon Insertion Left 11/23/13    Current Outpatient Prescriptions  Medication Sig Dispense Refill  . etonogestrel (NEXPLANON) 68 MG IMPL implant Inject 1 each into the skin once.    . sertraline (ZOLOFT) 50 MG tablet Take 50 mg by mouth every other day.     No current facility-administered medications for this visit.  ALLERGIES: Augmentin [amoxicillin-pot clavulanate] and Erythromycin  Family History  Problem Relation Age of Onset  . Cancer Father     cholangiocarcinoma  . Breast cancer Paternal Grandmother   . Hypertension Paternal Grandmother   . Diabetes Paternal Grandmother   . Hypertension Maternal Grandmother   . Diabetes Maternal Grandmother   . Cancer Paternal Grandfather     cholangiocarcinoma/liver ca  . Cancer Maternal Grandfather     pancreatic cancer  . Ovarian cancer Maternal Aunt     Social History   Social History  . Marital status: Married    Spouse name: N/A  . Number of children: N/A  . Years of education: N/A   Occupational History  . Not on file.   Social History Main Topics  . Smoking status: Never Smoker  .  Smokeless tobacco: Never Used  . Alcohol use No  . Drug use: No  . Sexual activity: Yes    Partners: Male    Birth control/ protection: Implant     Comment: Nexplanon inserted 11-23-13 (left arm)   Other Topics Concern  . Not on file   Social History Narrative  . No narrative on file    ROS:  Pertinent items are noted in HPI.  PHYSICAL EXAMINATION:    BP 102/64 (BP Location: Right Arm, Patient Position: Sitting, Cuff Size: Normal)   Pulse 60   Resp 14   Ht 4' 10.5" (1.486 m)   Wt 109 lb (49.4 kg)   LMP 11/17/2016   BMI 22.39 kg/m     General appearance: alert, cooperative and appears stated age  Nexplanon present in left arm.  Procedure - Nexplanon removal and reinsertion of new Nexplanon.  Nexplanon lot number [id number], exp 12/22/18 Consent for procedures.  Sterile prep of left arm with betadine and then alcohol swab. Local 1% lidocaine - lot number [id number], expiration 02/21. Incision made with scalpel over the tip of the Nexplanon.  Hemostat used to grasp Nexplanon, intact, shown to patient, and discarded.  New Nexplanon inserted in the same location without difficulty. Betadine wiped off.  Steristips and guaze wrap placed. New Nexplanon card to patient.  Minimal EBL.  No complications.   ASSESSMENT  Counseling for contraception. Removal of expiring Nexplanon and insertion of new Nexplanon. Irregular menses.  I think this is just the Nexplanon needing to be changed. Fatigue.  Depression and situational stress. Hx low vit D. Hx of resection of uterine septum.  Normal HSG showing complete resection and patent tubes.  PLAN  Instructions and precautions regarding Nexplanon.  I discussed quick return to fertility with removal of Nexplanon. Routine labs drawn today which will evaluate cholesterol but also fatigue - CBC, vit D, CMP, TSH. Start PNV. Discussion of Zoloft and no increased risk of congenital anomalies, specifically no increased risk of cardiac  malformation according to Up to Date today. We did this literature search together in the exam room. She is aware that there is increased risk of postpartum hemorrhage. I encouraged her to get in touch with her prescriber and increase her Zoloft dosage back up to where she was before.  Ref[facility] to [facility] counseling.  Patient knows she needs to call to make this appointment.  I recommended contact with social services if there are health and safety risks for the child.  Follow up for annual exam in 4 - 6 weeks.   An After Visit Summary was printed and given to the patient.  __25____ minutes face to face time of which over  50% was spent in counseling.  This was really a problem visit and also an exchange of her Nexplanon.

## 2016-11-22 LAB — VITAMIN D 25 HYDROXY (VIT D DEFICIENCY, FRACTURES): Vit D, 25-Hydroxy: 24 ng/mL — ABNORMAL LOW (ref 30–100)

## 2016-11-23 ENCOUNTER — Telehealth: Payer: Self-pay

## 2016-11-23 MED ORDER — VITAMIN D (ERGOCALCIFEROL) 1.25 MG (50000 UNIT) PO CAPS: 50000.0000 [IU] | ORAL_CAPSULE | ORAL | 0 refills | Status: AC

## 2016-11-23 NOTE — Telephone Encounter (Signed)
-----   Message from Patton SallesBrook E Amundson C Silva, MD sent at 11/23/2016  5:31 AM EST ----- Hello Sheri Cameron,   Your blood work is back, and your vitamin D level is low.  I am recommending prescription vitamin D 50,000 IU every other week for the next 3 months.  I will send this through to your pharmacy of record. I would then have you repeat this level at an office lab visit.   Your thyroid test and blood chemistries are all normal. Your blood counts show mildly elevated platelet levels.  This is in the range of what your levels have been in the past.  This is not of concern.   Please see your primary care provider for your Measles, Mumps and Rubella booster if you have not already done that.  We tested last year to see if you were immune to the [place] Measles, and you are not.  You must wait one month following the injection before trying for pregnancy.    The nurse will call to be sure you receive this message.   Merry Christmas!  Conley SimmondsBrook Silva, MD  Cc- Claudette LawsAmanda Dixon

## 2016-11-23 NOTE — Telephone Encounter (Signed)
Spoke with patient. Advised of message as seen below from [place]. Patient is agreeable and verbalizes understanding. Declines to schedule 3 month lab appointment at this time. Would like to call back to schedule this appointment. Patient will see her PCP for MMR booster.  Routing to provider for final review. Patient agreeable to disposition. Will close encounter.

## 2016-11-23 NOTE — Telephone Encounter (Signed)
Left message to call Kaitlyn at [phone]. 

## 2016-12-17 ENCOUNTER — Ambulatory Visit: Payer: BLUE CROSS/BLUE SHIELD | Admitting: Obstetrics and Gynecology

## 2017-02-12 ENCOUNTER — Telehealth: Payer: Self-pay

## 2017-02-12 ENCOUNTER — Other Ambulatory Visit: Payer: Self-pay | Admitting: Obstetrics and Gynecology

## 2017-02-12 NOTE — Telephone Encounter (Signed)
Left message for patient to call Sheri Cameron back. Patient needs to schedule lab appointment to have Vitamin D rechecked. Cannot send out refill until this is done.

## 2017-02-12 NOTE — Telephone Encounter (Signed)
Please contact patient to schedule a lab recheck.  I will not refill vit D at this time because we don't know if it is necessary.

## 2017-02-12 NOTE — Telephone Encounter (Signed)
Left message for patient to call Summer back.  

## 2017-02-12 NOTE — Telephone Encounter (Signed)
Medication refill request: Vitamin D Last AEX:  11/21/16 BS Next AEX: Not scheduled  Last MMG (if hormonal medication request):  Refill authorized: 11/23/16 #6 0R. Please advise. Thank you.

## 2017-02-18 NOTE — Telephone Encounter (Signed)
See Refill note dated 02/12/17.

## 2017-02-19 NOTE — Telephone Encounter (Signed)
Spoke with patient and advised needs to come in for lab work before refill of Vitamin D can be sent out. Patient verbalized understanding and agreement. Patient will call back when she looks at her schedule.

## 2018-01-08 ENCOUNTER — Telehealth: Payer: Self-pay | Admitting: Obstetrics and Gynecology

## 2018-01-08 NOTE — Telephone Encounter (Signed)
Spoke with patient. Patient states she does not plan to reschedule. States she has moved 2.5 hrs away, is looking for a new provider. Advised I will update Dr. Edward JollySilva, patient is agreeable.   Routing to provider for final review. Patient is agreeable to disposition. Will close encounter.

## 2018-01-08 NOTE — Telephone Encounter (Signed)
Please reschedule patient's annual exam with me.  She will need lab work done on the day of her visit.

## 2018-11-21 DIAGNOSIS — J324 Chronic pansinusitis: Secondary | ICD-10-CM | POA: Diagnosis not present

## 2018-11-24 DIAGNOSIS — Z760 Encounter for issue of repeat prescription: Secondary | ICD-10-CM | POA: Diagnosis not present

## 2019-01-05 DIAGNOSIS — Z0000 Encounter for general adult medical examination without abnormal findings: Secondary | ICD-10-CM | POA: Diagnosis not present

## 2019-01-14 DIAGNOSIS — H6592 Unspecified nonsuppurative otitis media, left ear: Secondary | ICD-10-CM | POA: Diagnosis not present

## 2019-01-14 DIAGNOSIS — H6092 Unspecified otitis externa, left ear: Secondary | ICD-10-CM | POA: Diagnosis not present

## 2019-01-14 DIAGNOSIS — Z0000 Encounter for general adult medical examination without abnormal findings: Secondary | ICD-10-CM | POA: Diagnosis not present

## 2020-10-17 ENCOUNTER — Telehealth: Payer: Self-pay

## 2020-10-17 NOTE — Telephone Encounter (Signed)
Patient states having nagging/shooting pain near insertion site of Nexplanon which just began. Last AEX 11-16-15, Nexplanon inserted 11-21-16. Patient knows she is overdue for removal of Nexplanon but just hasn't gotten in here.  Patient denies any redness, fever, fever to touch or severe pain. I made appointment to have site checked on 10-20-20 as a work in appointment. Advised patient at that time we would schedule AEX and Nexplanon exchange if she so desires. Our first priority was to make sure there was nothing urgent that needed to be addressed first.  Routed to Dr.Silva

## 2020-10-17 NOTE — Telephone Encounter (Signed)
Patient is calling in regards to having pains in arm where nexplanon is placed.

## 2020-10-18 NOTE — Telephone Encounter (Signed)
Encounter reviewed and closed.  

## 2020-10-20 ENCOUNTER — Ambulatory Visit: Payer: BC Managed Care – PPO | Admitting: Obstetrics and Gynecology

## 2020-10-20 ENCOUNTER — Telehealth: Payer: Self-pay

## 2020-10-20 NOTE — Progress Notes (Deleted)
GYNECOLOGY  VISIT   HPI: 39 y.o.   Married  Caucasian  female   G2P0020 with No LMP recorded. Patient has had an implant.   here for pain at Nexplanon incision.    GYNECOLOGIC HISTORY: No LMP recorded. Patient has had an implant. Contraception:  Nexplanon 11/2014--Lt.arm Menopausal hormone therapy:  n/a Last mammogram:  n/a Last pap smear: 11-16-15 Neg:Neg HR HPV, 06-04-13 Neg        OB History    Gravida  2   Para      Term      Preterm      AB  2   Living  0     SAB  2   TAB      Ectopic      Multiple      Live Births                 Patient Active Problem List   Diagnosis Date Noted  . Pseudoseizures (HCC)   . FUO (fever of unknown origin)   . Seizure-like activity (HCC)   . Left leg weakness 09/30/2015  . Severe back pain 09/30/2015  . Fever 09/30/2015  . Leukocytosis 09/30/2015  . Constipation 09/30/2015  . Anorexia 09/30/2015  . Depression with anxiety 09/30/2015  . Pelvic pain in female 04/13/2014  . Endometriosis 04/13/2014  . Acute right lower quadrant pain 04/13/2014    Past Medical History:  Diagnosis Date  . Anemia   . Anxiety   . Asthma    rarely and mild  . Depression   . Dysmenorrhea   . Dyspareunia   . E-coli UTI    history of ecoli  . Endometriosis   . Gastric ulcer   . [id number](638.7)     Past Surgical History:  Procedure Laterality Date  . APPENDECTOMY    . CERVICAL POLYPECTOMY N/A 10/06/2013   Procedure: CERVICAL POLYPECTOMY;  Surgeon: Melony Overly, MD;  Location: [facility];  Service: Gynecology;  Laterality: N/A;  . CHOLECYSTECTOMY    . DILATATION & CURRETTAGE/HYSTEROSCOPY WITH RESECTOCOPE N/A 10/06/2013   Procedure: HYSTEROSCOPY WITH RESECTION OF UTERINE SEPTUM;  Surgeon: Melony Overly, MD;  Location: [facility];  Service: Gynecology;  Laterality: N/A;  resection of uterine septum.   Marland Kitchen LAPAROSCOPY N/A 10/06/2013   Procedure: LAPAROSCOPY DIAGNOSTIC, FULGERATION OF ENDOMETRIOSIS;  Surgeon: Melony Overly, MD;  Location: [facility];  Service: Gynecology;  Laterality: N/A;  . Nexplanon Insertion Left 11/23/13    Current Outpatient Medications  Medication Sig Dispense Refill  . etonogestrel (NEXPLANON) 68 MG IMPL implant Inject 1 each into the skin once.    . sertraline (ZOLOFT) 50 MG tablet Take 50 mg by mouth every other day.    . Vitamin D, Ergocalciferol, (DRISDOL) 50000 units CAPS capsule Take 1 capsule (50,000 Units total) by mouth every 14 (fourteen) days. 6 capsule 0   No current facility-administered medications for this visit.     ALLERGIES: Augmentin [amoxicillin-pot clavulanate] and Erythromycin  Family History  Problem Relation Age of Onset  . Cancer Father        cholangiocarcinoma  . Breast cancer Paternal Grandmother   . Hypertension Paternal Grandmother   . Diabetes Paternal Grandmother   . Hypertension Maternal Grandmother   . Diabetes Maternal Grandmother   . Cancer Paternal Grandfather        cholangiocarcinoma/liver ca  . Cancer Maternal Grandfather        pancreatic cancer  . Ovarian cancer Maternal Aunt  Social History   Socioeconomic History  . Marital status: Married    Spouse name: Not on file  . Number of children: Not on file  . Years of education: Not on file  . Highest education level: Not on file  Occupational History  . Not on file  Tobacco Use  . Smoking status: Never Smoker  . Smokeless tobacco: Never Used  Substance and Sexual Activity  . Alcohol use: No    Alcohol/week: 0.0 standard drinks  . Drug use: No  . Sexual activity: Yes    Partners: Male    Birth control/protection: Implant    Comment: Nexplanon inserted 11-23-13 (left arm)  Other Topics Concern  . Not on file  Social History Narrative  . Not on file   Social Determinants of Health   Financial Resource Strain:   . Difficulty of Paying Living Expenses: Not on file  Food Insecurity:   . Worried About Programme researcher, broadcasting/film/video in the Last Year: Not on file  . Ran Out of Food in the Last  Year: Not on file  Transportation Needs:   . Lack of Transportation (Medical): Not on file  . Lack of Transportation (Non-Medical): Not on file  Physical Activity:   . Days of Exercise per Week: Not on file  . Minutes of Exercise per Session: Not on file  Stress:   . Feeling of Stress : Not on file  Social Connections:   . Frequency of Communication with Friends and Family: Not on file  . Frequency of Social Gatherings with Friends and Family: Not on file  . Attends Religious Services: Not on file  . Active Member of Clubs or Organizations: Not on file  . Attends Banker Meetings: Not on file  . Marital Status: Not on file  Intimate Partner Violence:   . Fear of Current or Ex-Partner: Not on file  . Emotionally Abused: Not on file  . Physically Abused: Not on file  . Sexually Abused: Not on file    Review of Systems  PHYSICAL EXAMINATION:    There were no vitals taken for this visit.    General appearance: alert, cooperative and appears stated age Head: Normocephalic, without obvious abnormality, atraumatic Neck: no adenopathy, supple, symmetrical, trachea midline and thyroid normal to inspection and palpation Lungs: clear to auscultation bilaterally Breasts: normal appearance, no masses or tenderness, No nipple retraction or dimpling, No nipple discharge or bleeding, No axillary or supraclavicular adenopathy Heart: regular rate and rhythm Abdomen: soft, non-tender, no masses,  no organomegaly Extremities: extremities normal, atraumatic, no cyanosis or edema Skin: Skin color, texture, turgor normal. No rashes or lesions Lymph nodes: Cervical, supraclavicular, and axillary nodes normal. No abnormal inguinal nodes palpated Neurologic: Grossly normal  Pelvic: External genitalia:  no lesions              Urethra:  normal appearing urethra with no masses, tenderness or lesions              Bartholins and Skenes: normal                 Vagina: normal appearing  vagina with normal color and discharge, no lesions              Cervix: no lesions                Bimanual Exam:  Uterus:  normal size, contour, position, consistency, mobility, non-tender  Adnexa: no mass, fullness, tenderness              Rectal exam: {yes no:[id number]}.  Confirms.              Anus:  normal sphincter tone, no lesions  Chaperone was present for exam.  ASSESSMENT     PLAN     An After Visit Summary was printed and given to the patient.  ______ minutes face to face time of which over 50% was spent in counseling.

## 2020-10-20 NOTE — Telephone Encounter (Signed)
Patient cancelled appointment due to feeling sick. Patient would like to reschedule.

## 2020-10-20 NOTE — Telephone Encounter (Signed)
Spoke with pt. Pt states not feeling well today. "Feels like has stomach bug". Pt states would like to call and reschedule OV when feeling better.  Routing to Dr Edward Jolly for update Encounter closed
